# Patient Record
Sex: Female | Born: 1965 | Race: Black or African American | Hispanic: No | Marital: Married | Smoking: Current some day smoker
Health system: Southern US, Community
[De-identification: ages and names within clinical notes are randomized; demographics above are authoritative.]

## PROBLEM LIST (undated history)

## (undated) DIAGNOSIS — F32A Depression, unspecified: Secondary | ICD-10-CM

## (undated) DIAGNOSIS — F329 Major depressive disorder, single episode, unspecified: Secondary | ICD-10-CM

## (undated) DIAGNOSIS — E669 Obesity, unspecified: Secondary | ICD-10-CM

## (undated) DIAGNOSIS — F419 Anxiety disorder, unspecified: Secondary | ICD-10-CM

## (undated) DIAGNOSIS — K219 Gastro-esophageal reflux disease without esophagitis: Secondary | ICD-10-CM

## (undated) DIAGNOSIS — G43909 Migraine, unspecified, not intractable, without status migrainosus: Secondary | ICD-10-CM

## (undated) HISTORY — DX: Obesity, unspecified: E66.9

## (undated) HISTORY — DX: Depression, unspecified: F32.A

## (undated) HISTORY — DX: Gastro-esophageal reflux disease without esophagitis: K21.9

## (undated) HISTORY — DX: Anxiety disorder, unspecified: F41.9

## (undated) HISTORY — DX: Major depressive disorder, single episode, unspecified: F32.9

## (undated) HISTORY — DX: Migraine, unspecified, not intractable, without status migrainosus: G43.909

---

## 1997-04-27 ENCOUNTER — Inpatient Hospital Stay (HOSPITAL_COMMUNITY): Admission: AD | Admit: 1997-04-27 | Discharge: 1997-04-30 | Payer: Self-pay | Admitting: Obstetrics & Gynecology

## 1997-06-17 ENCOUNTER — Observation Stay (HOSPITAL_COMMUNITY): Admission: AD | Admit: 1997-06-17 | Discharge: 1997-06-18 | Payer: Self-pay | Admitting: Obstetrics and Gynecology

## 1997-06-21 ENCOUNTER — Inpatient Hospital Stay (HOSPITAL_COMMUNITY): Admission: AD | Admit: 1997-06-21 | Discharge: 1997-06-21 | Payer: Self-pay | Admitting: Obstetrics and Gynecology

## 1997-06-22 ENCOUNTER — Inpatient Hospital Stay (HOSPITAL_COMMUNITY): Admission: AD | Admit: 1997-06-22 | Discharge: 1997-06-22 | Payer: Self-pay | Admitting: Obstetrics and Gynecology

## 1997-06-23 ENCOUNTER — Inpatient Hospital Stay (HOSPITAL_COMMUNITY): Admission: AD | Admit: 1997-06-23 | Discharge: 1997-06-23 | Payer: Self-pay | Admitting: Obstetrics and Gynecology

## 1997-06-24 ENCOUNTER — Inpatient Hospital Stay (HOSPITAL_COMMUNITY): Admission: AD | Admit: 1997-06-24 | Discharge: 1997-06-24 | Payer: Self-pay | Admitting: Obstetrics and Gynecology

## 1997-06-28 ENCOUNTER — Inpatient Hospital Stay (HOSPITAL_COMMUNITY): Admission: AD | Admit: 1997-06-28 | Discharge: 1997-06-30 | Payer: Self-pay | Admitting: Obstetrics and Gynecology

## 1997-07-08 ENCOUNTER — Inpatient Hospital Stay (HOSPITAL_COMMUNITY): Admission: AD | Admit: 1997-07-08 | Discharge: 1997-07-08 | Payer: Self-pay | Admitting: Obstetrics and Gynecology

## 1997-07-15 ENCOUNTER — Inpatient Hospital Stay (HOSPITAL_COMMUNITY): Admission: AD | Admit: 1997-07-15 | Discharge: 1997-07-15 | Payer: Self-pay | Admitting: Obstetrics and Gynecology

## 1997-07-16 ENCOUNTER — Inpatient Hospital Stay (HOSPITAL_COMMUNITY): Admission: AD | Admit: 1997-07-16 | Discharge: 1997-07-16 | Payer: Self-pay | Admitting: Obstetrics and Gynecology

## 1997-07-19 ENCOUNTER — Ambulatory Visit (HOSPITAL_COMMUNITY): Admission: RE | Admit: 1997-07-19 | Discharge: 1997-07-19 | Payer: Self-pay | Admitting: Obstetrics and Gynecology

## 1997-07-28 ENCOUNTER — Inpatient Hospital Stay (HOSPITAL_COMMUNITY): Admission: AD | Admit: 1997-07-28 | Discharge: 1997-07-28 | Payer: Self-pay | Admitting: Obstetrics and Gynecology

## 1997-07-29 ENCOUNTER — Inpatient Hospital Stay (HOSPITAL_COMMUNITY): Admission: AD | Admit: 1997-07-29 | Discharge: 1997-08-01 | Payer: Self-pay | Admitting: Obstetrics & Gynecology

## 1997-09-14 ENCOUNTER — Other Ambulatory Visit: Admission: RE | Admit: 1997-09-14 | Discharge: 1997-09-14 | Payer: Self-pay | Admitting: Obstetrics and Gynecology

## 1998-09-26 ENCOUNTER — Other Ambulatory Visit: Admission: RE | Admit: 1998-09-26 | Discharge: 1998-09-26 | Payer: Self-pay | Admitting: *Deleted

## 1999-09-28 ENCOUNTER — Other Ambulatory Visit: Admission: RE | Admit: 1999-09-28 | Discharge: 1999-09-28 | Payer: Self-pay | Admitting: *Deleted

## 2000-11-28 ENCOUNTER — Other Ambulatory Visit: Admission: RE | Admit: 2000-11-28 | Discharge: 2000-11-28 | Payer: Self-pay | Admitting: *Deleted

## 2000-12-20 ENCOUNTER — Encounter (INDEPENDENT_AMBULATORY_CARE_PROVIDER_SITE_OTHER): Payer: Self-pay | Admitting: Specialist

## 2000-12-20 ENCOUNTER — Other Ambulatory Visit: Admission: RE | Admit: 2000-12-20 | Discharge: 2000-12-20 | Payer: Self-pay | Admitting: *Deleted

## 2001-12-18 ENCOUNTER — Other Ambulatory Visit: Admission: RE | Admit: 2001-12-18 | Discharge: 2001-12-18 | Payer: Self-pay | Admitting: *Deleted

## 2002-04-03 ENCOUNTER — Encounter: Admission: RE | Admit: 2002-04-03 | Discharge: 2002-04-03 | Payer: Self-pay | Admitting: Emergency Medicine

## 2002-04-03 ENCOUNTER — Encounter: Payer: Self-pay | Admitting: Emergency Medicine

## 2002-12-15 ENCOUNTER — Other Ambulatory Visit: Admission: RE | Admit: 2002-12-15 | Discharge: 2002-12-15 | Payer: Self-pay | Admitting: *Deleted

## 2003-09-10 ENCOUNTER — Other Ambulatory Visit: Admission: RE | Admit: 2003-09-10 | Discharge: 2003-09-10 | Payer: Self-pay | Admitting: Obstetrics and Gynecology

## 2004-01-23 ENCOUNTER — Inpatient Hospital Stay (HOSPITAL_COMMUNITY): Admission: AD | Admit: 2004-01-23 | Discharge: 2004-01-24 | Payer: Self-pay | Admitting: Obstetrics and Gynecology

## 2004-03-27 ENCOUNTER — Encounter (INDEPENDENT_AMBULATORY_CARE_PROVIDER_SITE_OTHER): Payer: Self-pay | Admitting: *Deleted

## 2004-03-27 ENCOUNTER — Inpatient Hospital Stay (HOSPITAL_COMMUNITY): Admission: RE | Admit: 2004-03-27 | Discharge: 2004-03-29 | Payer: Self-pay | Admitting: Obstetrics and Gynecology

## 2004-04-03 ENCOUNTER — Inpatient Hospital Stay (HOSPITAL_COMMUNITY): Admission: AD | Admit: 2004-04-03 | Discharge: 2004-04-03 | Payer: Self-pay | Admitting: Obstetrics and Gynecology

## 2004-08-31 ENCOUNTER — Other Ambulatory Visit: Admission: RE | Admit: 2004-08-31 | Discharge: 2004-08-31 | Payer: Self-pay | Admitting: *Deleted

## 2005-02-19 HISTORY — PX: PARTIAL HYSTERECTOMY: SHX80

## 2005-07-19 ENCOUNTER — Encounter (INDEPENDENT_AMBULATORY_CARE_PROVIDER_SITE_OTHER): Payer: Self-pay | Admitting: Specialist

## 2005-07-19 ENCOUNTER — Inpatient Hospital Stay (HOSPITAL_COMMUNITY): Admission: RE | Admit: 2005-07-19 | Discharge: 2005-07-21 | Payer: Self-pay | Admitting: *Deleted

## 2005-10-29 ENCOUNTER — Other Ambulatory Visit: Admission: RE | Admit: 2005-10-29 | Discharge: 2005-10-29 | Payer: Self-pay | Admitting: *Deleted

## 2006-12-23 ENCOUNTER — Other Ambulatory Visit: Admission: RE | Admit: 2006-12-23 | Discharge: 2006-12-23 | Payer: Self-pay | Admitting: *Deleted

## 2007-12-23 ENCOUNTER — Other Ambulatory Visit: Admission: RE | Admit: 2007-12-23 | Discharge: 2007-12-23 | Payer: Self-pay | Admitting: Gynecology

## 2008-01-02 ENCOUNTER — Encounter: Admission: RE | Admit: 2008-01-02 | Discharge: 2008-01-02 | Payer: Self-pay | Admitting: Gynecology

## 2008-03-08 ENCOUNTER — Other Ambulatory Visit: Admission: RE | Admit: 2008-03-08 | Discharge: 2008-03-08 | Payer: Self-pay | Admitting: Gynecology

## 2009-01-03 ENCOUNTER — Encounter: Admission: RE | Admit: 2009-01-03 | Discharge: 2009-01-03 | Payer: Self-pay | Admitting: Obstetrics and Gynecology

## 2010-04-18 ENCOUNTER — Other Ambulatory Visit: Payer: Self-pay | Admitting: Obstetrics and Gynecology

## 2010-07-07 NOTE — Discharge Summary (Signed)
NAME:  Linda Garner, HAUGE           ACCOUNT NO.:  [id number]   MEDICAL RECORD NO.:  [id number]          PATIENT TYPE:  INP   LOCATION:  9132                          FACILITY:  [facility]   PHYSICIAN:  Michelle L. Grewal, M.D.DATE OF BIRTH:  [birth year]   DATE OF ADMISSION:  03/27/2004  DATE OF DISCHARGE:  03/29/2004                                 DISCHARGE SUMMARY   ADMITTING DIAGNOSES:  1.  Intrauterine pregnancy at term.  2.  Previous cesarean section, desires repeat.  3.  Multiparity, desires permanent sterilization.   DISCHARGE DIAGNOSES:  1.  Status post low transverse cesarean section.  2.  Viable female infant.  3.  Permanent sterilization.   PROCEDURE:  1.  Repeat low transverse cesarean section.  2.  Bilateral tubal ligation.   REASON FOR ADMISSION:  Please see dictated H&P.   HOSPITAL COURSE:  The patient was a 45 year old African-American married  female gravida 2 para 1 that was admitted to [facility] for  a scheduled cesarean section. The patient had a history of a previous  cesarean and desired repeat. Due to multiparity, the patient had also  requested permanent sterilization. On the morning of admission, the patient  was taken to the operating room where spinal anesthesia was administered  without difficulty. A low transverse incision was made with the delivery of  a viable female infant weighing 6 pounds 9 ounces with Apgars of 9 at one  minute and 9 at five minutes. Arterial cord pH was 7.29. The patient  tolerated the procedure well and was taken to the recovery room in stable  condition. On postoperative day #1, the patient was without complaint. Vital  signs were stable. Abdomen was soft, fundus was firm and nontender.  Abdominal dressing was clean, dry, and intact. Labs revealed hemoglobin of  10.1; platelet count 167,000; wbc count of 9.2. on postoperative day #2, the  patient was without complaint. Vital signs remained stable, she was  afebrile. Abdomen was soft, fundus was firm and nontender. The patient  desired early discharge. The staples were removed and the patient was  discharged home.   CONDITION ON DISCHARGE:  Good.   DIET:  Regular as tolerated.   ACTIVITY:  No heavy lifting, no driving x2 weeks, no vaginal entry.   FOLLOW-UP:  The patient is to follow up in the office in 1-2 weeks for an  incision check. She is to call for temperature greater than 100 degrees,  persistent nausea and vomiting, heavy vaginal bleeding, and/or redness or  drainage from the incisional site.   DISCHARGE MEDICATIONS:  1.  Tylox #30 one p.o. q.4-6h. p.r.n.  2.  Motrin 600 mg q.6h.  3.  Prenatal vitamins one p.o. daily.  4.  Colace one p.o. daily p.r.n.      CC/MEDQ  D:  04/17/2004  T:  04/17/2004  Job:  [id number]

## 2010-07-07 NOTE — Discharge Summary (Signed)
NAME:  Linda Garner, Linda Garner           ACCOUNT NO.:  [id number]   MEDICAL RECORD NO.:  [id number]          PATIENT TYPE:  INP   LOCATION:  9302                          FACILITY:  [facility]   PHYSICIAN:  Almedia Balls. Fore, M.D.   DATE OF BIRTH:  1965-03-21   DATE OF ADMISSION:  07/19/2005  DATE OF DISCHARGE:  07/21/2005                                 DISCHARGE SUMMARY   HISTORY:  The patient is a 45 year old with abnormal uterine bleeding,  pelvic pain, slight uterine enlargement for hysterectomy.  The remainder of  her history and physical are as previously dictated.   LABORATORY DATA:  Include preoperative hemoglobin 12.3, 6200 white blood  cells.   HOSPITAL COURSE:  The patient was taken to operating room on 19 Jul 2005, at  which time abdominal supracervical hysterectomy, bilateral salpingectomy,  revision of abdominal wall keloid were performed.  The patient did well  postoperatively.  Diet and ambulation were progressed over several days  postoperatively.  On the morning of 21 July 2005, she was afebrile and  experiencing no problems except for pain which was controlled by oral  analgesics.  It was felt that she could be discharged at this time.   FINAL DIAGNOSES:  1.  Abnormal uterine bleeding.  2.  Pelvic pain.  3.  Slight uterine enlargement.  4.  Abdominal wall keloid.   OPERATION:  Abdominal supracervical hysterectomy, bilateral salpingectomy,  revision of abdominal wall keloid.   PATHOLOGY REPORT:  Unavailable at time of dictation.   DISPOSITION:  Discharged home to return the office in 2 weeks for followup.   She was instructed to gradually progress activities over several weeks at  home and to limit lifting and driving for 2 weeks.  She was fully  ambulatory, on regular diet, and in good condition at the time of discharge.   She was given prescriptions for:  1.  Darvocet N 100 generic, #30, to be taken 1/2 to 2 q.6h. p.r.n. pain.  2.  Doxycycline 100 mg, #12, to be taken  1 b.i.d..           ______________________________  Almedia Balls Randell Patient, M.D.     SRF/MEDQ  D:  07/21/2005  T:  07/21/2005  Job:  [id number]

## 2010-07-07 NOTE — Op Note (Signed)
Linda Garner, Linda Garner           ACCOUNT NO.:  [id number]   MEDICAL RECORD NO.:  [id number]          PATIENT TYPE:  INP   LOCATION:  9399                          [facility]ACILITY:  [facility]   PHYSICIAN:  Almedia Balls. Fore, M.D.   DATE OF BIRTH:  1965/08/05   DATE OF PROCEDURE:  07/19/2005  DATE OF DISCHARGE:                                 OPERATIVE REPORT   PREOPERATIVE DIAGNOSES:  1.  Abnormal uterine bleeding.  2.  Pelvic pain.  3.  Slight uterine enlargement.  4.  Abdominal wall keloid.   POSTOPERATIVE DIAGNOSIS:  1.  Abnormal uterine bleeding.  2.  Pelvic pain.  3.  Slight uterine enlargement.  4.  Abdominal wall keloid.  5.  Pending pathology.   OPERATIONS:  1.  Abdominal supracervical hysterectomy.  2.  Revision of abdominal wall keloid.   ANESTHESIA:  General orotracheal.   OPERATOR:  Almedia Balls. Randell Patient, M.D.   FIRST ASSISTANT:  Gretta Cool, M.D.   INDICATIONS FOR SURGERY:  The patient is a 45 year old with the above-noted  problems who has been counseled as to the need for surgery to correct these  problems and the type of procedure to be performed, to include risks of  anesthesia, injury to bowel, bladder, blood vessels, ureters, postoperative  hemorrhage, infection, recuperation, and possible hormone replacement should  her ovaries be removed.  She fully understands all these considerations and  wishes to proceed on Jul 19, 2005.   OPERATIVE FINDINGS:  On entry into the abdomen, there were noted to be  several adhesions from the omentum to the anterior peritoneal surface  secondary to previous surgery.  The upper abdominal viscera were palpated  and found to be normal.  The area of the appendix was normal.  In the  pelvis, the uterus was mid-posterior, top normal size to approximately [redacted]  weeks gestational size.  Both ovaries appeared to be normal.   PROCEDURE:  With the patient under general anesthesia, prepared and draped  in usual sterile fashion, with a Foley  catheter in the bladder, a lower  abdominal transverse incision was made after excising a previous abdominal  wall keloid.  The incision was carried down to the peritoneal cavity with  care to ensure that injury to the underlying bowel was not carried out.  The  peritoneum was then entered, and a self-retaining retractor was placed.  Bowel was packed off.  The utero-ovarian anastomoses to the round ligaments  were clamped using Kelly clamps for traction.  The round ligaments  bilaterally were transected using Bovie electrocoagulation with development  of a bladder flap anteriorly.  This area was also adherent to the lower  uterine segment because of previous surgery.  Care was taken to avoid injury  to the bladder, which was then dissected off the lower uterine segment and  cervix.  Because the ovaries appeared normal, it was felt that they should  be conserved.  Accordingly, Heaney clamps were placed across the utero-  ovarian anastomoses and tubal mesenteries bilaterally; the tubes were  removed by incising this area with the ovarian stump and rendered hemostatic  with two  sutures of 0 Vicryl.  Uterine vessels bilaterally were then  skeletonized, clamped using Heaney clamps, cut, and suture ligated with 0  Vicryl.  Cardinal ligaments were then clamped with Heaney clamps, cut, and  suture ligated with 0 Vicryl.  It was then possible to excise the uterus  with endocervix by using Bovie electrocoagulation to core out these  structures.  The remaining rim of cervix was rendered hemostatic and  reapproximated with interrupted figure-of-eight sutures of 0 Vicryl.  The  area was lavaged with copious amounts of lactated Ringer's solution, and  after noting that hemostasis was maintained in this area, the area was  reperitonealized with a continuous suture of 3-0 PDS, with the ovaries being  suspended from the ipsilateral round ligaments.  Again, with correct sponge  and instrument count and good  hemostasis, the peritoneum was closed with  continuous suture of 0 Vicryl.  Fascia was closed with two sutures of 0 PDS  which were brought from the lateral aspects of the incision and tied in the  midline.  Each layer was lavaged with copious amounts of lactated Ringer's  solution prior to closure.  Subcutaneous fat was reapproximated with  interrupted mattress sutures of 0 Vicryl.  Skin was closed with a  subcuticular suture of 3-0 plain catgut.  Estimated blood loss:  250 mL.  The patient was taken to the recovery room in good condition, with clear  urine in the Foley catheter tubing.  She will be placed on outpatient with  extended recovery status following recovery room.           ______________________________  Almedia Balls Randell Patient, M.D.     SRF/MEDQ  D:  07/19/2005  T:  07/19/2005  Job:  [id number]   cc:   Gretta Cool, M.D.  Fax: [phone]

## 2010-07-07 NOTE — H&P (Signed)
NAME:  Linda Garner, Linda Garner           ACCOUNT NO.:  [id number]   MEDICAL RECORD NO.:  [id number]          PATIENT TYPE:  INP   LOCATION:  NA                            FACILITY:  [facility]   PHYSICIAN:  Guy Sandifer. Tomblin II, M.D.DATE OF BIRTH:  15-May-1965   DATE OF ADMISSION:  03/27/2004  DATE OF DISCHARGE:                                HISTORY & PHYSICAL   CHIEF COMPLAINT:  Desires repeat cesarean section.   HISTORY OF PRESENT ILLNESS:  This patient is a 45 year old, married, black  female with an EDC of April 05, 2004 consistent with early examination  whose prenatal care has been complicated by advanced maternal age.  First  trimester nuchal lucency ultrasound scanning was normal.  However, the blood  testing was consistent with an increased risk of Down syndrome.  Subsequent  level 2 ultrasound at 18 weeks revealed normal anatomy and the patient  elected to not purse amniocentesis.  She has a history of cesarean section  at 38 weeks.  After discussing the options, she wants repeat.  Group B beta  strep cultures is negative on March 07, 2004.   Past medical history, past surgical history, family history, obstetric  history, see prenatal history and physical.   MEDICATIONS:  Prenatal vitamins.   ALLERGIES:  SULFA, CODEINE and PENICILLIN.   SOCIAL HISTORY:  The patient denies tobacco, alcohol or drug abuse.   PHYSICAL EXAMINATION:  VITAL SIGNS:  Height 5 feet 2 inches, weight 194  pounds, blood pressure 108/80.  LUNGS:  Clear to auscultation.  HEART:  Regular rate and rhythm.  BREASTS:  Not examined.  ABDOMEN:  Gravid, nontender.  PELVIC:  Cervix is closed and thick.   ASSESSMENT:  1.  Intrauterine pregnancy at 68 weeks estimated gestational age.  2.  Previous cesarean section, desires repeat.   PLAN:  Repeat cesarean section.      JET/MEDQ  D:  03/24/2004  T:  03/24/2004  Job:  [id number]

## 2010-07-07 NOTE — Op Note (Signed)
NAME:  Linda Garner, Linda Garner           ACCOUNT NO.:  [id number]   MEDICAL RECORD NO.:  [id number]          PATIENT TYPE:  INP   LOCATION:  9132                          [facility]ACILITY:  [facility]   PHYSICIAN:  Guy Sandifer. Tomblin II, M.D.DATE OF BIRTH:  04/13/1965   DATE OF PROCEDURE:  03/27/2004  DATE OF DISCHARGE:                                 OPERATIVE REPORT   PREOPERATIVE DIAGNOSIS:  1.  Intrauterine pregnancy at term.  2.  Previous cesarean section, desires repeat.  3.  Desires permanent sterilization.   POSTOPERATIVE DIAGNOSIS:  1.  Intrauterine pregnancy at term.  2.  Previous cesarean section, desires repeat.  3.  Desires permanent sterilization.   PROCEDURE:  1.  Repeat cesarean section.  2.  Bilateral tubal ligation.   SURGEON:  Guy Sandifer. Henderson Cloud, M.D.   ANESTHESIA:  Spinal.   ESTIMATED BLOOD LOSS:  600 mL.   FINDINGS:  Viable female infant with Apgars of 9 and 9 at one and five  minutes, respectively.  Birth weight 6 pounds 9 ounces, arterial cord pH  7.29.   INDICATIONS FOR PROCEDURE:  This patient is a 45 year old married black  female with an EDC of April 05, 2004, who has previous cesarean section  and desires a repeat.  She also desires permanent sterilization.  Potential  risks and complications have been discussed preoperatively.  Tubal ligation  permanence, alternative methods of contraception, failure rate, and  increased ectopic risks have also been reviewed preoperatively. All  questions were answered.   DESCRIPTION OF PROCEDURE:  The patient was taken to the operating room where  she was identified, spinal anesthetic was placed, and she was placed in the  dorsal supine position with a 15 degree left lateral wedge.  She was then  prepped abdominally, Foley catheter was placed in the bladder as a drain,  and she was draped in a sterile fashion.  After testing for adequate spinal  anesthesia, the skin was entered through the Pfannenstiel incision and  dissection  is carried out in layers to the peritoneum.  The peritoneum is  incised and extended superiorly and inferiorly.  The vesicouterine  peritoneum is taken down cephalolaterally.  The bladder flap is developed  and the bladder blade is placed.  The uterus is incised in a low transverse  manner.  The uterine cavity is entered bluntly with a Hemostat.  The uterine  incision is extended cephalolaterally with the fingers.  Artificial rupture  of membranes reveals clear fluid.  Vertex is then delivered with the aid of  a Tendertouch vacuum with one gentle pull without difficulty.  Oral and  nasopharynx were suctioned.  Single nuchal cord is reduced.  Remainder of  the baby is delivered.  Good cry and tone is noted.  The cord is clamped and  cut and the baby is handed to the awaiting pediatrics team.  The placenta is  manually delivered.  The uterine cavity is cleaned.  The uterus is closed in  two running locking imbricating layers of 0 Monocryl suture.  An additional  figure-of-eight is placed at the right angle to achieve complete hemostasis.  Ovaries  are normal bilaterally.  The left fallopian tube is identified from  cornua to fimbria.  It is then grasped in the midampullary portion with a  Babcock clamp and a knuckle of tube is ligated with two free ties of 0 plain  suture.  The intervening knuckle is then resected sharply.  Cautery is used  to assure complete hemostasis.  Similar procedure is carried out on the  right side.  Irrigation is carried out.  The anterior peritoneum is closed  in a running fashion with 0 Monocryl suture which is also used to  reapproximate the pyramidalis muscle in the midline.  Anterior rectus fascia  is closed in a running fashion with 0 PDS suture and the skin is closed with  clips.  All needle, sponge, and instrument counts correct.  The patient is  transferred to the recovery room in stable condition.      JET/MEDQ  D:  03/27/2004  T:  03/27/2004  Job:   [id number]

## 2010-07-07 NOTE — H&P (Signed)
NAME:  Linda Garner, Linda Garner           ACCOUNT NO.:  [id number]   MEDICAL RECORD NO.:  [id number]          PATIENT TYPE:  INP   LOCATION:  NA                            [facility]   PHYSICIAN:  Almedia Balls. Fore, M.D.   DATE OF BIRTH:  1965-10-09   DATE OF ADMISSION:  07/19/2005  DATE OF DISCHARGE:                                HISTORY & PHYSICAL   CHIEF COMPLAINT:  Abnormal bleeding, pelvic pain, uterine enlargement.   HISTORY:  The patient is a 45 year old gravida 3, para 2 whose last  menstrual period was Jun 19, 2005.  She has been seen in our office since May  of 1998 with progressively severe pain.  She has had two pregnancies since  that time with cesarean sections each time.  Uterus on recent examination  was found to be enlarged and soft with tender areas within the uterus.  Ultrasound and biopsy done in August of 2006 revealed uterine enlargement,  __________ myometrium suggestive of adenomyosis, and small ovarian cysts  bilaterally suggestive of follicles.  The biopsy was benign.  Pap smear in  July of 2006 was within normal limits as have all her Pap smears been.  She  is admitted at this time for TAH, possible BSO.  She has been fully  counseled as to the nature of the procedure and the risks involved to  include risks of anesthesia, injury to bowel, bladder, blood vessels,  ureters, postoperative hemorrhage, infection, recuperation, and possible  hormone replacement should her ovaries be removed.  She fully understands  all these considerations and wishes to proceed on Jul 19, 2005.   PAST MEDICAL HISTORY:  Includes the above-noted history plus ectopic  pregnancy with partial salpingectomy of the right tube in 1990.  She  underwent laparoscopy in 1994 and 1997 with findings of probable  endometriosis.  She is a nonsmoker, nondrinker.  Allergic to CODEINE and  SULFA.   FAMILY HISTORY:  Grandmother died of some form of leukemia.   REVIEW OF SYSTEMS:  HEENT:  Negative per  prior.  CARDIORESPIRATORY:  Negative.  GASTROINTESTINAL:  Negative.  GENITOURINARY:  As in present  illness.  NEUROMUSCULAR:  Negative.   PHYSICAL EXAMINATION:  VITAL SIGNS:  Height 5 feet, 2-1/2 inches tall,  weight 174 pounds, blood pressure 118/74, pulse 80, respirations 16.  GENERAL:  Well-developed black female in no acute distress.  HEENT: Within normal limits.  NECK:  Supple without mass, adenopathy, or bruit.  HEART:  Regular rate and rhythm without murmurs.  LUNGS:  Clear to P&A.  BREASTS:  Sitting and lying without masses.  Axilla negative.  ABDOMEN:  Flat and soft without mass.  Slightly tender in the lower central  area.  PELVIC:  External genitalia:  Bartholin's, urethral, and Skene's glands  within normal limits.  Vagina is clean.  Cervix is slightly inflamed.  Uterus is mid position, top normal size to approximately [redacted] weeks gestational  size, and tender on palpation and manipulation.  Adnexa are slightly tender  bilaterally, right greater than left.  Anterior and posterior cul-de-sac  examination is confirmatory.  EXTREMITIES:  Within normal limits.  CENTRAL NERVOUS SYSTEM:  Grossly intact.  SKIN:  Without suspicious lesions.   IMPRESSION:  1.  Abnormal uterine bleeding.  2.  Pelvic pain.  3.  Uterine enlargement.   DISPOSITION:  As noted above.           ______________________________  Almedia Balls. Randell Patient, M.D.     SRF/MEDQ  D:  07/12/2005  T:  07/12/2005  Job:  [id number]

## 2011-10-19 ENCOUNTER — Ambulatory Visit (INDEPENDENT_AMBULATORY_CARE_PROVIDER_SITE_OTHER): Payer: Managed Care, Other (non HMO) | Admitting: Emergency Medicine

## 2011-10-19 VITALS — BP 115/75 | HR 80 | Temp 98.5°F | Resp 16 | Ht 62.25 in | Wt 174.2 lb

## 2011-10-19 DIAGNOSIS — N3000 Acute cystitis without hematuria: Secondary | ICD-10-CM

## 2011-10-19 DIAGNOSIS — M545 Low back pain: Secondary | ICD-10-CM

## 2011-10-19 DIAGNOSIS — R309 Painful micturition, unspecified: Secondary | ICD-10-CM

## 2011-10-19 DIAGNOSIS — N23 Unspecified renal colic: Secondary | ICD-10-CM

## 2011-10-19 LAB — POCT URINALYSIS DIPSTICK
Bilirubin, UA: NEGATIVE
Ketones, UA: NEGATIVE
Spec Grav, UA: 1.02
pH, UA: 7

## 2011-10-19 LAB — POCT UA - MICROSCOPIC ONLY: Crystals, Ur, HPF, POC: NEGATIVE

## 2011-10-19 MED ORDER — PHENAZOPYRIDINE HCL 200 MG PO TABS: 200.0000 mg | ORAL_TABLET | Freq: Three times a day (TID) | ORAL | Status: AC | PRN

## 2011-10-19 MED ORDER — CIPROFLOXACIN HCL 500 MG PO TABS: 500.0000 mg | ORAL_TABLET | Freq: Two times a day (BID) | ORAL | Status: AC

## 2011-10-19 NOTE — Progress Notes (Signed)
Date:  10/19/2011   Name:  Linda Garner   DOB:  1965-10-21   MRN:  [id number] Gender: female Age: 46 y.o.  PCP:  No primary provider on file.    Chief Complaint: Urinary Frequency   History of Present Illness:  Linda Garner is a 46 y.o. pleasant patient who presents with the following:  Today developed dysuria, urgency and frequency and foul smelling urine.  No fever or chills, nausea or vomiting, no vaginal discharge or bleeding. Noticed some blood on tissue after voiding.  No stool change.  Some low back pain.  There is no problem list on file for this patient.   No past medical history on file.  No past surgical history on file.  History  Substance Use Topics  . Smoking status: Never Smoker   . Smokeless tobacco: Not on file  . Alcohol Use: Not on file    No family history on file.  No Known Allergies  Medication list has been reviewed and updated.  No current outpatient prescriptions on file prior to visit.    Review of Systems:  As per HPI, otherwise negative.    Physical Examination: Filed Vitals:   10/19/11 1614  BP: 115/75  Pulse: 80  Temp: 98.5 F (36.9 C)  Resp: 16   Filed Vitals:   10/19/11 1614  Height: 5' 2.25" (1.581 m)  Weight: 174 lb 3.2 oz (79.017 kg)   Body mass index is 31.61 kg/(m^2). Ideal Body Weight: Weight in (lb) to have BMI = 25: 137.5    GEN: WDWN, NAD, Non-toxic, Alert & Oriented x 3 HEENT: Atraumatic, Normocephalic.  Ears and Nose: No external deformity. EXTR: No clubbing/cyanosis/edema NEURO: Normal gait.  PSYCH: Normally interactive. Conversant. Not depressed or anxious appearing.  Calm demeanor.  Abdomen:  Some suprapubic tenderness.  No flank tenderness  Results for orders placed in visit on 10/19/11  POCT URINALYSIS DIPSTICK      Component Value Range   Color, UA yellow     Clarity, UA slightly cloudy     Glucose, UA negative     Bilirubin, UA negative     Ketones, UA negative     Spec Grav, UA 1.020     Blood, UA trace-lysed     pH, UA 7.0     Protein, UA negative     Urobilinogen, UA 0.2     Nitrite, UA negative     Leukocytes, UA small (1+)       Assessment and Plan: Cystitis cipro pyridium Follow up as needed Carmelina Dane, MD  Results for orders placed in visit on 10/19/11  POCT UA - MICROSCOPIC ONLY      Component Value Range   WBC, Ur, HPF, POC 1-3     RBC, urine, microscopic 0-1     Bacteria, U Microscopic trace     Mucus, UA trace     Epithelial cells, urine per micros 3-5     Crystals, Ur, HPF, POC negative     Casts, Ur, LPF, POC negative     Yeast, UA negative    POCT URINALYSIS DIPSTICK      Component Value Range   Color, UA yellow     Clarity, UA slightly cloudy     Glucose, UA negative     Bilirubin, UA negative     Ketones, UA negative     Spec Grav, UA 1.020     Blood, UA trace-lysed     pH, UA 7.0  Protein, UA negative     Urobilinogen, UA 0.2     Nitrite, UA negative     Leukocytes, UA small (1+)

## 2012-05-09 ENCOUNTER — Other Ambulatory Visit: Payer: Self-pay | Admitting: Obstetrics and Gynecology

## 2012-05-09 DIAGNOSIS — R928 Other abnormal and inconclusive findings on diagnostic imaging of breast: Secondary | ICD-10-CM

## 2012-05-22 ENCOUNTER — Ambulatory Visit
Admission: RE | Admit: 2012-05-22 | Discharge: 2012-05-22 | Disposition: A | Discharge status: Home / Self Care | Payer: Private Health Insurance - Indemnity | Source: Ambulatory Visit | Attending: Obstetrics and Gynecology | Admitting: Obstetrics and Gynecology

## 2012-05-22 DIAGNOSIS — R928 Other abnormal and inconclusive findings on diagnostic imaging of breast: Secondary | ICD-10-CM

## 2014-01-08 ENCOUNTER — Telehealth: Payer: Self-pay | Admitting: Internal Medicine

## 2014-01-08 NOTE — Telephone Encounter (Deleted)
PT NEEDS 3 MORE WRITTEN RX'S FOR ATEROL FOR DEC, JAN, AND FEB , PLS MAIL

## 2016-06-06 ENCOUNTER — Telehealth: Payer: Self-pay | Admitting: Cardiovascular Disease

## 2016-06-06 NOTE — Telephone Encounter (Signed)
Received records from [facility] for appointment on 06/26/16 with Dr Allyson Sabal.  Records put with Dr Hazle Coca schedule for 06/26/16. lp

## 2016-06-08 ENCOUNTER — Ambulatory Visit: Payer: Private Health Insurance - Indemnity | Admitting: Cardiovascular Disease

## 2016-06-21 ENCOUNTER — Encounter: Payer: Self-pay | Admitting: Anesthesiology

## 2016-06-26 ENCOUNTER — Encounter: Payer: Self-pay | Admitting: Cardiovascular Disease

## 2016-06-26 ENCOUNTER — Ambulatory Visit (INDEPENDENT_AMBULATORY_CARE_PROVIDER_SITE_OTHER): Payer: Private Health Insurance - Indemnity | Admitting: Cardiovascular Disease

## 2016-06-26 VITALS — BP 116/78 | HR 79 | Ht 61.0 in | Wt 188.2 lb

## 2016-06-26 DIAGNOSIS — R0789 Other chest pain: Secondary | ICD-10-CM

## 2016-06-26 DIAGNOSIS — R072 Precordial pain: Secondary | ICD-10-CM | POA: Diagnosis not present

## 2016-06-26 NOTE — Assessment & Plan Note (Signed)
Ms. Linda Garner is a 51 year old moderately overweight female mother of 2 referred by Dr. Sherwood GamblerFusco for atypical chest pain. 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 She has a lot of stress at home as well as at work. She's had new-onset chest pain over the last month occurring on a daily basis. The pain is stabbing, sharp and lasts for seconds. There are no other associated symptoms. I believe her chest pain is noncardiac. She does have risk factors including family history the father who had stents. I am going to get a routine GXT and see her back as needed. minutes at a time

## 2016-06-26 NOTE — Progress Notes (Signed)
     06/26/2016 Linda SisLaura Holberg   01/02/1966  [id number]  Primary Physician Elfredia NevinsFusco, Lawrence, MD Primary Cardiologist: Runell GessJonathan J Tynisha Ogan MD Roseanne RenoFACP, FACC, FAHA, FSCAI  HPI:    Ms. Linda Garner is a 51 year old moderately overweight married female mother of 2 children referred by Dr. Sherwood GamblerFusco  For new-onset atypical chest pain. Her only  Risk  factor is family history.  She is apparently under a lot of tress at home and at work. She has never had a heart attack or stroke.  The pain is new onset over the last month, substernal  Occurring on a  Daily basis lasting for minute at a time.  Current Outpatient Prescriptions  Medication Sig Dispense Refill  . ALPRAZolam (XANAX) 0.5 MG tablet Take 0.5 mg by mouth at bedtime as needed for anxiety.    . Multiple Vitamin (MULTIVITAMIN) tablet Take 1 tablet by mouth daily.    . phentermine 37.5 MG capsule Take 37.5 mg by mouth every morning.     No current facility-administered medications for this visit.     No Known Allergies  Social History   Social History  . Marital status: Married    Spouse name: N/A  . Number of children: 2  . Years of education: N/A   Occupational History  . UNEMPLOYED    Social History Main Topics  . Smoking status: Never Smoker  . Smokeless tobacco: Never Used  . Alcohol use Not on file  . Drug use: No  . Sexual activity: Not on file   Other Topics Concern  . Not on file   Social History Narrative  . No narrative on file     Review of Systems: General: negative for chills, fever, night sweats or weight changes.  Cardiovascular: negative for chest pain, dyspnea on exertion, edema, orthopnea, palpitations, paroxysmal nocturnal dyspnea or shortness of breath Dermatological: negative for rash Respiratory: negative for cough or wheezing Urologic: negative for hematuria Abdominal: negative for nausea, vomiting, diarrhea, bright red blood per rectum, melena, or hematemesis Neurologic: negative for visual changes, syncope, or  dizziness All other systems reviewed and are otherwise negative except as noted above.    Blood pressure 116/78, pulse 79, height 5\' 1"  (1.549 m), weight 188 lb 3.2 oz (85.4 kg).  General appearance: alert and no distress Neck: no adenopathy, no carotid bruit, no JVD, supple, symmetrical, trachea midline and thyroid not enlarged, symmetric, no tenderness/mass/nodules Lungs: clear to auscultation bilaterally Heart: regular rate and rhythm, S1, S2 normal, no murmur, click, rub or gallop Extremities: extremities normal, atraumatic, no cyanosis or edema  EKG normal sinus rhythm at 79 without ST or T-wave changes. I personally reviewed this EKG  ASSESSMENT AND PLAN:   Atypical chest pain Ms. Linda Garner is a 51 year old moderately overweight female mother of 2 referred by Dr. Sherwood GamblerFusco for atypical chest pain. 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 She has a lot of stress at home as well as at work. She's had new-onset chest pain over the last month occurring on a daily basis. The pain is stabbing, sharp and lasts for seconds. There are no other associated symptoms. I believe her chest pain is noncardiac. She does have risk factors including family history the father who had stents. I am going to get a routine GXT and see her back as needed. minutes at a time      Runell GessJonathan J. Kaysa Roulhac MD BaldwinFACP,FACC,FAHA, [facility]SCAI 06/26/2016 4:15 PM

## 2016-06-26 NOTE — Patient Instructions (Signed)
Your physician has requested that you have an exercise tolerance test. For further information please visit [email or web address]. Please also follow instruction sheet, as given.  Dr Berry recommends that you follow-up with him as needed. 

## 2016-07-04 ENCOUNTER — Telehealth (HOSPITAL_COMMUNITY): Payer: Self-pay | Admitting: Cardiovascular Disease

## 2016-07-05 NOTE — Telephone Encounter (Signed)
  07/04/2016 02:49 PM Phone (Outgoing) Claudius Sisrice, [place] (Self) [phone] (H)   Left Message - Called pt and lmsg for her to CB to get r/s for ETT    By Elita BooneGriffin, Preslee Regas A

## 2016-07-10 ENCOUNTER — Inpatient Hospital Stay (HOSPITAL_COMMUNITY): Admission: RE | Admit: 2016-07-10 | Payer: Private Health Insurance - Indemnity | Source: Ambulatory Visit

## 2016-07-17 ENCOUNTER — Ambulatory Visit: Payer: Private Health Insurance - Indemnity | Admitting: Cardiovascular Disease

## 2016-07-31 ENCOUNTER — Telehealth (HOSPITAL_COMMUNITY): Payer: Self-pay | Admitting: Cardiovascular Disease

## 2016-07-31 NOTE — Telephone Encounter (Signed)
Patient called back today and voiced that she did not want to reschedule her ETT at this time .  User: Trina AoGriffin, Keean Wilmeth A Date/time: 07/31/2016 2:03 PM  Comment: Called pt and lmsg for her to CB to get r/s for ETT.  Context: [place] Schedule Orders/Appt Requests Outcome: Left Message  Phone number: [phone] Phone Type: Home Phone  Comm. type: Telephone Call type: Outgoing  Contact: Claudius SisPrice, Brooks Relation to patient: Self  Letter:      User: Trina AoGriffin, Gem Conkle A Date/time: 07/19/2016 10:44 AM  Comment: Called pt and lmsg for her to CB to r/s GXT.  Context: [place] Schedule Orders/Appt Requests Outcome: Left Message  Phone number: [phone] Phone Type: Home Phone  Comm. type: Telephone Call type: Outgoing  Contact: Claudius SisPrice, Latasha Relation to patient: Self  Letter:      User: Trina AoGriffin, Shakeila Pfarr A Date/time: 07/04/2016 2:50 PM  Comment: Called pt and lmsg for her to CB to get r/s for ETT.   Context: [place] Schedule Orders/Appt Requests Outcome: Left Message  Phone number: [phone] Phone Type: Home Phone  Comm. type: Telephone Call type: Outgoing  Contact: Claudius SisPrice, Alianys Relation to patient: Self  Letter:

## 2017-04-08 ENCOUNTER — Encounter: Payer: Self-pay | Admitting: Gastroenterology

## 2017-05-06 ENCOUNTER — Ambulatory Visit: Payer: Private Health Insurance - Indemnity

## 2017-05-16 ENCOUNTER — Ambulatory Visit (INDEPENDENT_AMBULATORY_CARE_PROVIDER_SITE_OTHER): Payer: No Typology Code available for payment source

## 2017-05-16 DIAGNOSIS — Z1211 Encounter for screening for malignant neoplasm of colon: Secondary | ICD-10-CM

## 2017-05-16 NOTE — Progress Notes (Signed)
Gastroenterology Pre-Procedure Review  Request Date:05/16/17 Requesting Physician: Dwyane LuoBen Mann PA-C Dr.Golding ( no previous tcs)  PATIENT REVIEW QUESTIONS: The patient responded to the following health history questions as indicated:    1. Diabetes Melitis: no 2. Joint replacements in the past 12 months: no 3. Major health problems in the past 3 months: no 4. Has an artificial valve or MVP: no 5. Has a defibrillator: no 6. Has been advised in past to take antibiotics in advance of a procedure like teeth cleaning: no 7. Family history of colon cancer: no  8. Alcohol Use: no 9. History of sleep apnea: no  10. History of coronary artery or other vascular stents placed within the last 12 months: no 11. History of any prior anesthesia complications: no    MEDICATIONS & ALLERGIES:    Patient reports the following regarding taking any blood thinners:   Plavix? no Aspirin? no Coumadin? no Brilinta? no Xarelto? no Eliquis? no Pradaxa? no Savaysa? no Effient? no  Patient confirms/reports the following medications:  Current Outpatient Medications  Medication Sig Dispense Refill  . Multiple Vitamin (MULTIVITAMIN) tablet Take 1 tablet by mouth daily.     No current facility-administered medications for this visit.     Patient confirms/reports the following allergies:  Allergies  Allergen Reactions  . Codeine Other (See Comments)    No orders of the defined types were placed in this encounter.   AUTHORIZATION INFORMATION Primary Insurance: ,  ID #: ,  Group #:  Pre-Cert / Auth required: Pre-Cert / Auth #  Secondary Insurance: ,  ID #: ,  Group #:  Pre-Cert / Auth required:  Pre-Cert / Auth #:   SCHEDULE INFORMATION: Procedure has been scheduled as follows:  Date:    , Time: Location: [facility] Dr.Fields  This Gastroenterology Pre-Precedure Review Form is being routed to the following provider(s): Wynne DustEric Gill NP

## 2017-05-16 NOTE — Progress Notes (Signed)
Pt is unable to schedule at this time, she is going to check some dates and will call me back and instructions will be mailed.

## 2017-05-17 NOTE — Progress Notes (Signed)
Ok to schedule.

## 2017-05-20 ENCOUNTER — Encounter: Payer: Managed Care, Other (non HMO) | Admitting: Obstetrics and Gynecology

## 2018-01-09 ENCOUNTER — Encounter: Payer: Self-pay | Admitting: Obstetrics and Gynecology

## 2018-05-26 ENCOUNTER — Encounter: Payer: Managed Care, Other (non HMO) | Admitting: Obstetrics and Gynecology

## 2018-06-10 ENCOUNTER — Encounter: Payer: Self-pay | Admitting: Gastroenterology

## 2018-09-01 ENCOUNTER — Ambulatory Visit: Payer: No Typology Code available for payment source

## 2018-09-17 ENCOUNTER — Other Ambulatory Visit: Payer: Self-pay

## 2018-09-17 ENCOUNTER — Other Ambulatory Visit: Payer: No Typology Code available for payment source

## 2018-09-17 DIAGNOSIS — Z20822 Contact with and (suspected) exposure to covid-19: Secondary | ICD-10-CM

## 2018-09-18 LAB — NOVEL CORONAVIRUS, NAA: SARS-CoV-2, NAA: NOT DETECTED

## 2018-09-24 ENCOUNTER — Telehealth: Payer: Self-pay | Admitting: Internal Medicine

## 2018-09-24 NOTE — Telephone Encounter (Signed)
Pt called to get COVID results.  Informed pt those were negative.  Signed pt up for MyChart

## 2018-10-16 ENCOUNTER — Other Ambulatory Visit: Payer: Self-pay

## 2018-10-16 ENCOUNTER — Ambulatory Visit (INDEPENDENT_AMBULATORY_CARE_PROVIDER_SITE_OTHER): Payer: Self-pay | Admitting: *Deleted

## 2018-10-16 DIAGNOSIS — Z1211 Encounter for screening for malignant neoplasm of colon: Secondary | ICD-10-CM

## 2018-10-16 NOTE — Progress Notes (Addendum)
Gastroenterology Pre-Procedure Review  Request Date: 10/16/2018 Requesting Physician: Dr. Gerarda Fraction @ [facility], No previous TCS  PATIENT REVIEW QUESTIONS: The patient responded to the following health history questions as indicated:    1. Diabetes Melitis: No 2. Joint replacements in the past 12 months: No 3. Major health problems in the past 3 months: No 4. Has an artificial valve or MVP: No 5. Has a defibrillator: No 6. Has been advised in past to take antibiotics in advance of a procedure like teeth cleaning: No 7. Family history of colon cancer: No  8. Alcohol Use: Yes, 1 glass of wine a week 9. History of sleep apnea:  No 10. History of coronary artery or other vascular stents placed within the last 12 months: No 11. History of any prior anesthesia complications: No    MEDICATIONS & ALLERGIES:    Patient reports the following regarding taking any blood thinners:   Plavix? No Aspirin? No Coumadin? No Brilinta? No Xarelto? No Eliquis? No Pradaxa? No Savaysa? No Effient? No  Patient confirms/reports the following medications:  Current Outpatient Medications  Medication Sig Dispense Refill  . Multiple Vitamin (MULTIVITAMIN) tablet Take 1 tablet by mouth daily.     No current facility-administered medications for this visit.     Patient confirms/reports the following allergies:  Allergies  Allergen Reactions  . Codeine Other (See Comments)    No orders of the defined types were placed in this encounter.   AUTHORIZATION INFORMATION Primary Insurance: [place],  [place] #: [id number],  Group #: [id number] Pre-Cert / Josem Kaufmann required: No, not required  SCHEDULE INFORMATION: Procedure has been scheduled as follows:  Date: 12/22/2018 , Time: 10:30 Location: [facility] with Dr. Oneida Alar  This Gastroenterology Pre-Precedure Review Form is being routed to the following provider(s): Neil Crouch, PA-C

## 2018-10-16 NOTE — Progress Notes (Signed)
Pt wants to wait until we can schedule her on a Mon.  We did not have any available in Oct.  Pt aware that I will call her when Nov schedules are available.  Pt voiced understanding.

## 2018-10-17 NOTE — Progress Notes (Signed)
Ok to schedule with conscious sedation when she is ready.

## 2018-10-21 MED ORDER — PEG 3350-KCL-NA BICARB-NACL 420 G PO SOLR: 4000.0000 mL | Freq: Once | ORAL | 0 refills | Status: AC

## 2018-10-21 NOTE — Patient Instructions (Addendum)
Linda Garner   04-08-1965 MRN: [id number]    Procedure Date: 01/26/2019 Time to register: 9:30 am Place to register: [facility] Procedure Time: 10:30 am Scheduled provider: Dr. Oneida Alar  PREPARATION FOR COLONOSCOPY WITH TRI-LYTE SPLIT PREP  Please notify us immediately if you are diabetic, take iron supplements, or if you are on Coumadin or any other blood thinners.   You will need to purchase 1 fleet enema and 1 box of Bisacodyl '5mg'$  tablets.   1 DAY BEFORE PROCEDURE:  DATE: 01/25/2019  DAY: Sunday Continue clear liquids the entire day - NO SOLID FOOD.    At 2:00 pm:  Take 2 Bisacodyl tablets.   At 4:00pm:  Start drinking your solution. Make sure you mix well per instructions on the bottle. Try to drink 1 (one) 8 ounce glass every 10-15 minutes until you have consumed HALF the jug. You should complete by 6:00pm.You must keep the left over solution refrigerated until completed next day.  Continue clear liquids. You must drink plenty of clear liquids to prevent dehyration and kidney failure.     DAY OF PROCEDURE:   DATE: 01/26/2019  DAY: Monday If you take medications for your heart, blood pressure or breathing, you may take these medications.   Five hours before your procedure time @ 5:30 am:  Finish remaining amout of bowel prep, drinking 1 (one) 8 ounce glass every 10-15 minutes until complete. You have two hours to consume remaining prep.   Three hours before your procedure time @ 7:30 am:  Nothing by mouth.   At least one hour before going to the hospital:  Give yourself one Fleet enema. You may take your morning medications with sip of water unless we have instructed otherwise.      Please see below for Dietary Information.  CLEAR LIQUIDS INCLUDE:  Water Jello (NOT red in color)   Ice Popsicles (NOT red in color)   Tea (sugar ok, no milk/cream) Powdered fruit flavored drinks  Coffee (sugar ok, no milk/cream) Gatorade/ Lemonade/ Kool-Aid  (NOT red in color)    Juice: apple, white grape, white cranberry Soft drinks  Clear bullion, consomme, broth (fat free beef/chicken/vegetable)  Carbonated beverages (any kind)  Strained chicken noodle soup Hard Candy   Remember: Clear liquids are liquids that will allow you to see your fingers on the other side of a clear glass. Be sure liquids are NOT red in color, and not cloudy, but CLEAR.  DO NOT EAT OR DRINK ANY OF THE FOLLOWING:  Dairy products of any kind   Cranberry juice Tomato juice / V8 juice   Grapefruit juice Orange juice     Red grape juice  Do not eat any solid foods, including such foods as: cereal, oatmeal, yogurt, fruits, vegetables, creamed soups, eggs, bread, crackers, pureed foods in a blender, etc.   HELPFUL HINTS FOR DRINKING PREP SOLUTION:   Make sure prep is extremely cold. Mix and refrigerate the the morning of the prep. You may also put in the freezer.   You may try mixing some Crystal Light or Country Time Lemonade if you prefer. Mix in small amounts; add more if necessary.  Try drinking through a straw  Rinse mouth with water or a mouthwash between glasses, to remove after-taste.  Try sipping on a cold beverage /ice/ popsicles between glasses of prep.  Place a piece of sugar-free hard candy in mouth between glasses.  If you become nauseated, try consuming smaller amounts, or stretch out the time between  glasses. Stop for 30-60 minutes, then slowly start back drinking.        OTHER INSTRUCTIONS  You will need a responsible adult at least 52 years of age to accompany you and drive you home. This person must remain in the waiting room during your procedure. The hospital will cancel your procedure if you do not have a responsible adult with you.   1. Wear loose fitting clothing that is easily removed. 2. Leave jewelry and other valuables at home.  3. Remove all body piercing jewelry and leave at home. 4. Total time from sign-in until discharge is approximately 2-3  hours. 5. You should go home directly after your procedure and rest. You can resume normal activities the day after your procedure. 6. The day of your procedure you should not:  Drive  Make legal decisions  Operate machinery  Drink alcohol  Return to work   You may call the office (Dept: [phone]) before 5:00pm, or page the doctor on call [phone]) after 5:00pm, for further instructions, if necessary.   Insurance Information YOU WILL NEED TO CHECK WITH YOUR INSURANCE COMPANY FOR THE BENEFITS OF COVERAGE YOU HAVE FOR THIS PROCEDURE.  UNFORTUNATELY, NOT ALL INSURANCE COMPANIES HAVE BENEFITS TO COVER ALL OR PART OF THESE TYPES OF PROCEDURES.  IT IS YOUR RESPONSIBILITY TO CHECK YOUR BENEFITS, HOWEVER, WE WILL BE GLAD TO ASSIST YOU WITH ANY CODES YOUR INSURANCE COMPANY MAY NEED.    PLEASE NOTE THAT MOST INSURANCE COMPANIES WILL NOT COVER A SCREENING COLONOSCOPY FOR PEOPLE UNDER THE AGE OF 50  IF YOU HAVE BCBS INSURANCE, YOU MAY HAVE BENEFITS FOR A SCREENING COLONOSCOPY BUT IF POLYPS ARE FOUND THE DIAGNOSIS WILL CHANGE AND THEN YOU MAY HAVE A DEDUCTIBLE THAT WILL NEED TO BE MET. SO PLEASE MAKE SURE YOU CHECK YOUR BENEFITS FOR A SCREENING COLONOSCOPY AS WELL AS A DIAGNOSTIC COLONOSCOPY.

## 2018-10-21 NOTE — Addendum Note (Signed)
Addended by: Metro Kung on: 10/21/2018 10:35 AM   Modules accepted: Orders, SmartSet

## 2018-10-21 NOTE — Progress Notes (Signed)
Called pt and scheduled her for a procedure on 12/22/2018 and Covid screening on 12/19/2018.  Pt aware that we are sending prep to pharmacy and mailing her prep instructions.  Pt voiced understanding.

## 2018-10-21 NOTE — Addendum Note (Signed)
Addended by: Metro Kung on: 10/21/2018 10:28 AM   Modules accepted: Orders

## 2018-11-06 NOTE — Progress Notes (Addendum)
Rescheduled pt to 01/26/2019 due to [facility] being out.  Pt is aware that I am mailing out new prep instructions and Covid screening info.  Endo notified.

## 2018-11-20 ENCOUNTER — Other Ambulatory Visit: Payer: Self-pay | Admitting: *Deleted

## 2018-11-20 DIAGNOSIS — Z20822 Contact with and (suspected) exposure to covid-19: Secondary | ICD-10-CM

## 2018-11-21 ENCOUNTER — Telehealth: Payer: Self-pay | Admitting: General Practice

## 2018-11-21 LAB — NOVEL CORONAVIRUS, NAA: SARS-CoV-2, NAA: NOT DETECTED

## 2018-11-21 NOTE — Telephone Encounter (Signed)
Negative COVID results given. Patient results "NOT Detected." Caller expressed understanding. ° °

## 2018-12-19 ENCOUNTER — Other Ambulatory Visit (HOSPITAL_COMMUNITY): Payer: Managed Care, Other (non HMO)

## 2018-12-31 ENCOUNTER — Telehealth: Payer: Self-pay | Admitting: Gastroenterology

## 2018-12-31 NOTE — Telephone Encounter (Signed)
Called pt and she wanted to hold off on rescheduling for now.  She said that she may give [place] a call back at the beginning of next year to reschedule her procedure.  Pt aware that if it becomes more than 6 months that she would require another referral.  Pt voiced understanding.  Endo notified.

## 2018-12-31 NOTE — Telephone Encounter (Signed)
PATIENT NEEDS TO CANCEL HER PROCEDURE FOR NOW AND WILL RESCHEDULE NEXT YEAR

## 2019-01-05 ENCOUNTER — Other Ambulatory Visit: Payer: Self-pay | Admitting: Orthopedic Surgery

## 2019-01-05 DIAGNOSIS — M259 Joint disorder, unspecified: Secondary | ICD-10-CM

## 2019-01-19 ENCOUNTER — Ambulatory Visit
Admission: RE | Admit: 2019-01-19 | Discharge: 2019-01-19 | Disposition: A | Discharge status: Home / Self Care | Payer: 59 | Source: Ambulatory Visit | Attending: Orthopedic Surgery | Admitting: Orthopedic Surgery

## 2019-01-19 ENCOUNTER — Other Ambulatory Visit: Payer: Self-pay

## 2019-01-19 DIAGNOSIS — M259 Joint disorder, unspecified: Secondary | ICD-10-CM

## 2019-01-23 ENCOUNTER — Other Ambulatory Visit (HOSPITAL_COMMUNITY): Payer: Managed Care, Other (non HMO)

## 2019-01-26 ENCOUNTER — Ambulatory Visit (HOSPITAL_COMMUNITY): Admit: 2019-01-26 | Payer: Managed Care, Other (non HMO) | Admitting: Gastroenterology

## 2019-01-26 ENCOUNTER — Encounter (HOSPITAL_COMMUNITY): Payer: Self-pay

## 2019-01-26 SURGERY — COLONOSCOPY
Anesthesia: Moderate Sedation

## 2019-10-27 ENCOUNTER — Other Ambulatory Visit: Payer: 59

## 2019-10-27 ENCOUNTER — Other Ambulatory Visit: Payer: Self-pay | Admitting: Radiology

## 2019-10-27 DIAGNOSIS — Z20822 Contact with and (suspected) exposure to covid-19: Secondary | ICD-10-CM

## 2019-10-28 LAB — SARS-COV-2, NAA 2 DAY TAT

## 2019-10-28 LAB — NOVEL CORONAVIRUS, NAA: SARS-CoV-2, NAA: NOT DETECTED

## 2020-02-16 ENCOUNTER — Other Ambulatory Visit: Payer: 59

## 2020-02-16 DIAGNOSIS — Z20822 Contact with and (suspected) exposure to covid-19: Secondary | ICD-10-CM

## 2020-02-17 LAB — SARS-COV-2, NAA 2 DAY TAT

## 2020-02-17 LAB — NOVEL CORONAVIRUS, NAA: SARS-CoV-2, NAA: DETECTED — AB

## 2020-02-22 ENCOUNTER — Other Ambulatory Visit: Payer: 59

## 2020-05-03 ENCOUNTER — Ambulatory Visit (HOSPITAL_COMMUNITY)
Admission: RE | Admit: 2020-05-03 | Discharge: 2020-05-03 | Disposition: A | Discharge status: Home / Self Care | Payer: 59 | Source: Ambulatory Visit | Attending: Internal Medicine | Admitting: Internal Medicine

## 2020-05-03 ENCOUNTER — Other Ambulatory Visit: Payer: Self-pay

## 2020-05-03 ENCOUNTER — Other Ambulatory Visit (HOSPITAL_COMMUNITY): Payer: Self-pay | Admitting: Internal Medicine

## 2020-05-03 DIAGNOSIS — R059 Cough, unspecified: Secondary | ICD-10-CM | POA: Insufficient documentation

## 2021-04-06 ENCOUNTER — Other Ambulatory Visit: Payer: Self-pay | Admitting: Obstetrics and Gynecology

## 2021-04-06 DIAGNOSIS — Z1231 Encounter for screening mammogram for malignant neoplasm of breast: Secondary | ICD-10-CM

## 2021-04-25 ENCOUNTER — Ambulatory Visit
Admission: RE | Admit: 2021-04-25 | Discharge: 2021-04-25 | Disposition: A | Discharge status: Home / Self Care | Payer: 59 | Source: Ambulatory Visit | Attending: Obstetrics and Gynecology | Admitting: Obstetrics and Gynecology

## 2021-04-25 DIAGNOSIS — Z1231 Encounter for screening mammogram for malignant neoplasm of breast: Secondary | ICD-10-CM

## 2022-05-10 ENCOUNTER — Encounter: Payer: Self-pay | Admitting: Internal Medicine

## 2022-05-10 DIAGNOSIS — Z1231 Encounter for screening mammogram for malignant neoplasm of breast: Secondary | ICD-10-CM

## 2022-05-21 ENCOUNTER — Other Ambulatory Visit (HOSPITAL_COMMUNITY): Payer: Self-pay | Admitting: Family Medicine

## 2022-05-21 DIAGNOSIS — R109 Unspecified abdominal pain: Secondary | ICD-10-CM

## 2022-06-01 ENCOUNTER — Encounter (HOSPITAL_COMMUNITY): Payer: Self-pay

## 2022-06-01 ENCOUNTER — Other Ambulatory Visit (HOSPITAL_COMMUNITY): Payer: 59

## 2022-06-04 ENCOUNTER — Emergency Department (HOSPITAL_COMMUNITY): Payer: PRIVATE HEALTH INSURANCE

## 2022-06-04 ENCOUNTER — Emergency Department (HOSPITAL_COMMUNITY)
Admission: EM | Admit: 2022-06-04 | Discharge: 2022-06-04 | Disposition: A | Discharge status: Home / Self Care | Payer: PRIVATE HEALTH INSURANCE | Attending: Student | Admitting: Student

## 2022-06-04 ENCOUNTER — Encounter (HOSPITAL_COMMUNITY): Payer: Self-pay

## 2022-06-04 DIAGNOSIS — M5416 Radiculopathy, lumbar region: Secondary | ICD-10-CM | POA: Diagnosis not present

## 2022-06-04 DIAGNOSIS — M545 Low back pain, unspecified: Secondary | ICD-10-CM | POA: Diagnosis present

## 2022-06-04 LAB — I-STAT CHEM 8, ED
BUN: 16 mg/dL (ref 6–20)
Calcium, Ion: 1.19 mmol/L (ref 1.15–1.40)
Chloride: 102 mmol/L (ref 98–111)
Creatinine, Ser: 0.8 mg/dL (ref 0.44–1.00)
Glucose, Bld: 87 mg/dL (ref 70–99)
HCT: 37 % (ref 36.0–46.0)
Hemoglobin: 12.6 g/dL (ref 12.0–15.0)
Potassium: 3.8 mmol/L (ref 3.5–5.1)
Sodium: 139 mmol/L (ref 135–145)
TCO2: 27 mmol/L (ref 22–32)

## 2022-06-04 LAB — I-STAT BETA HCG BLOOD, ED (MC, WL, AP ONLY): I-stat hCG, quantitative: <5 m[IU]/mL (ref <5)

## 2022-06-04 MED ORDER — KETOROLAC TROMETHAMINE 30 MG/ML IJ SOLN
30.0000 mg | Freq: Once | INTRAMUSCULAR | Status: AC
  Administered 2022-06-04: 30 mg via INTRAMUSCULAR
  Filled 2022-06-04: qty 1

## 2022-06-04 NOTE — ED Provider Notes (Signed)
[place] EMERGENCY DEPARTMENT AT [facility] Provider Note   CSN: [id number] Arrival date & time: 06/04/22  1806     History Chief Complaint  Patient presents with   Sciatica    Linda Garner is a 57 y.o. female.  Patient with past history significant for sciatica presents emergency department complaints of right low back pain with radiation to the right leg.  She reports that she was previously seen at [facility] years ago for this and was treated there but was advised that she likely would need further treatment if her symptoms are returning.  She reports that she was able to have several years without any recurrence of her symptoms or not reporting significant right-sided low back pain with radiation into the leg.  She typically avoids taking any medication for pain given Tylenol or ibuprofen but states that she did use an Aleve earlier but did not have any improvement in pain at that time.  Not currently set up with any orthopedist or neurosurgery group.  Currently denies any saddle paresthesia, bowel or bladder incontinence, or weakness.  She is having a difficult time finding a comfortable position.  Denies any urinary symptoms such as dysuria, hematuria, increased urinary frequency or urgency.  HPI     Home Medications Prior to Admission medications   Medication Sig Start Date End Date Taking? Authorizing Provider  Multiple Vitamin (MULTIVITAMIN) tablet Take 1 tablet by mouth daily.    [provider]      Allergies    Codeine    Review of Systems   Review of Systems  Musculoskeletal:  Positive for back pain.  All other systems reviewed and are negative.   Physical Exam Updated Vital Signs BP (!) 159/71 (BP Location: Left Arm)   Pulse 88   Temp 98.1 F (36.7 C) (Oral)   Resp 16   Ht 5\' 1"  (1.549 m)   Wt 85.3 kg   SpO2 100%   BMI 35.52 kg/m  Physical Exam Vitals and nursing note reviewed.  Constitutional:      General: She is not in acute  distress.    Appearance: She is well-developed.  HENT:     Head: Normocephalic and atraumatic.  Eyes:     Conjunctiva/sclera: Conjunctivae normal.  Cardiovascular:     Rate and Rhythm: Normal rate and regular rhythm.     Heart sounds: No murmur heard. Pulmonary:     Effort: Pulmonary effort is normal. No respiratory distress.     Breath sounds: Normal breath sounds.  Abdominal:     Palpations: Abdomen is soft.     Tenderness: There is no abdominal tenderness.  Musculoskeletal:        General: No swelling.     Cervical back: Neck supple.  Skin:    General: Skin is warm and dry.     Capillary Refill: Capillary refill takes less than 2 seconds.  Neurological:     General: No focal deficit present.     Mental Status: She is alert. Mental status is at baseline.     Sensory: No sensory deficit.     Motor: No weakness.     Gait: Gait abnormal.     Deep Tendon Reflexes: Reflexes are normal and symmetric.     Reflex Scores:      Patellar reflexes are 2+ on the right side and 2+ on the left side.      Achilles reflexes are 2+ on the right side and 2+ on the left  side. Psychiatric:        Mood and Affect: Mood normal.     ED Results / Procedures / Treatments   Labs (all labs ordered are listed, but only abnormal results are displayed) Labs Reviewed  I-STAT BETA HCG BLOOD, ED ([facility], [facility], [facility] ONLY)  I-STAT CHEM 8, ED    EKG None  Radiology DG Lumbar Spine Complete  Result Date: 06/04/2022 CLINICAL DATA:  Low back pain EXAM: LUMBAR SPINE - COMPLETE 4+ VIEW COMPARISON:  None Available. FINDINGS: Five lumbar-type vertebral bodies. Normal lumbar lordosis. No evidence of fracture or dislocation. Vertebral body heights and intervertebral disc spaces are maintained. Visualized bony pelvis appears intact. IMPRESSION: Negative. Electronically Signed   By: Charline Bills M.D.   On: 06/04/2022 19:03    Procedures Procedures   Medications Ordered in ED Medications  ketorolac (TORADOL)  30 MG/ML injection 30 mg (30 mg Intramuscular Given 06/04/22 1928)    ED Course/ Medical Decision Making/ A&P                           Medical Decision Making Amount and/or Complexity of Data Reviewed Radiology: ordered.  Risk Prescription drug management.   This patient presents to the ED for concern of sciatica.  Differential diagnosis includes lumbar radiculopathy, cauda equina syndrome, loss epidural abscess, disc herniation   Lab Tests:  I Ordered, and personally interpreted labs.  The pertinent results include: I-STAT Chem-8 unremarkable, i-STAT beta-hCG negative   Imaging Studies ordered:  I ordered imaging studies including x-ray of lumbar spine I independently visualized and interpreted imaging which showed no acute fractures or dislocations I agree with the radiologist interpretation   Medicines ordered and prescription drug management:  I ordered medication including Toradol for pain Reevaluation of the patient after these medicines showed that the patient is unchanged I have reviewed the patients home medicines and have made adjustments as needed   Problem List / ED Course:  Patient presents to the emergency department complaints of right-sided low back pain with radiation into the right leg.  She reports has been going on for few days and has been progressively worsening.  Previously been treated for sciatica but reports that her insurance changed recently and will be unable to follow-up with the same orthopedist that previously assisted her.  Currently denying concerning red flag symptoms such as cauda equina-like presentation with no evidence of any saddle paresthesias, bowel or bladder incontinence.  Patient also lacking other red flag symptoms such as anticoagulant use, fever, immunocompromise state, IV drug use, trauma.  Dose of Toradol given to patient.  Emergency department for improved symptomatic relief.  Patient denies any obvious improvement while here in  the ED this likely would not take effect this quickly for her.  Advised patient to manage symptoms at home as best she can with over-the-counter pain medication such as Tylenol ibuprofen.  Preferably I would have patient take ibuprofen to discuss maximum dose ibuprofen that she should be taking and scheduled to take this medication.  Also provided patient with information for [place] [facility] so she may seek evaluation if she feels her symptoms or not improving or they begin to worsen.  Did discuss all red flag precautions to have patient return to the emergency department such as lower leg and extremity numbness or loss of motor function, saddle paresthesia, loss of bowel or bladder control, poikilothermia.  Patient verbalized understanding treatment plan and all return precautions.  All questions answered  prior to patient discharge.   Social Determinants of Health:  Recent insurance change and no longer able to be seen by her orthopedic surgeon  Final Clinical Impression(s) / ED Diagnoses Final diagnoses:  Lumbar radiculopathy, right    Rx / DC Orders ED Discharge Orders     None         Salomon Mast 06/04/22 2240    Glendora Score, MD 06/05/22 0126

## 2022-06-04 NOTE — ED Notes (Signed)
Pt verbalized understanding of discharge instructions. Pt ambulated from Ed .

## 2022-06-04 NOTE — Discharge Instructions (Signed)
You are seen in the emergency department for low back pain.  I believe this is consistent with sciatica or lumbar radiculopathy.  She manage her symptoms this is a can at home with over-the-counter pain medication such as Tylenol, ibuprofen, Aleve.  If you take ibuprofen, take ibuprofen 800 mg 3 times a day.  You begin to experience any neurological symptoms such as a numb groin, bowel or bladder incontinence, or inability to walk due to foot immobility, please return to the emergency department for further evaluation.

## 2022-06-04 NOTE — ED Notes (Signed)
Patient transported to X-ray 

## 2022-06-04 NOTE — ED Triage Notes (Signed)
Pt states that she is having right sided sciatica. Pt has hx of same. Pt states her insurance changed, used to be seen at [facility]. Pt states no relief with Aleve.

## 2022-10-30 ENCOUNTER — Encounter: Payer: Self-pay | Admitting: Obstetrics and Gynecology

## 2023-01-06 IMAGING — MG MM DIGITAL SCREENING BILAT W/ TOMO AND CAD
8 series · 8 of 24 positions shown · non-contrast
Comparison: Previous exam(s).

ACR Breast Density Category a: The breast tissue is almost entirely
fatty.

CLINICAL DATA: Screening.

EXAM:
DIGITAL SCREENING BILATERAL MAMMOGRAM WITH TOMOSYNTHESIS AND CAD
TECHNIQUE: Bilateral screening digital craniocaudal and mediolateral oblique
mammograms were obtained. Bilateral screening digital breast
tomosynthesis was performed. The images were evaluated with
computer-aided detection.

[L MLO synth-2D]
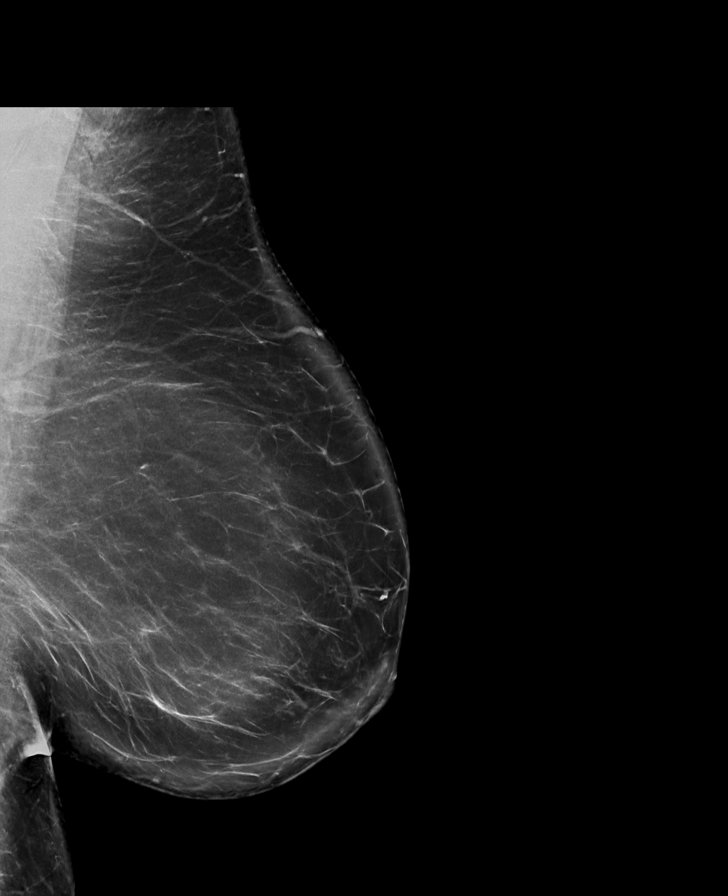

[R CC synth-2D]
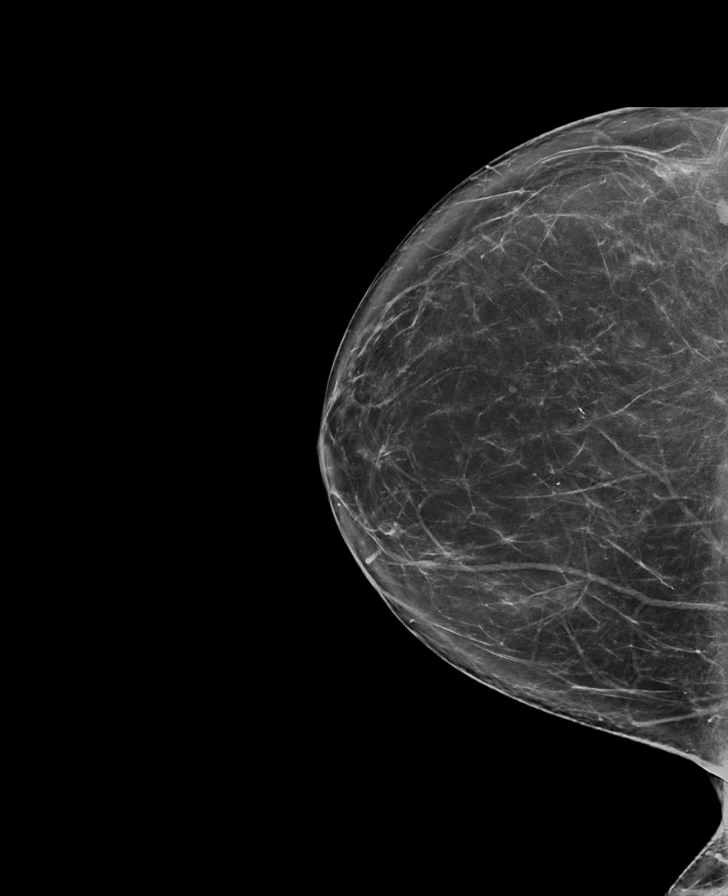

[R MLO synth-2D]
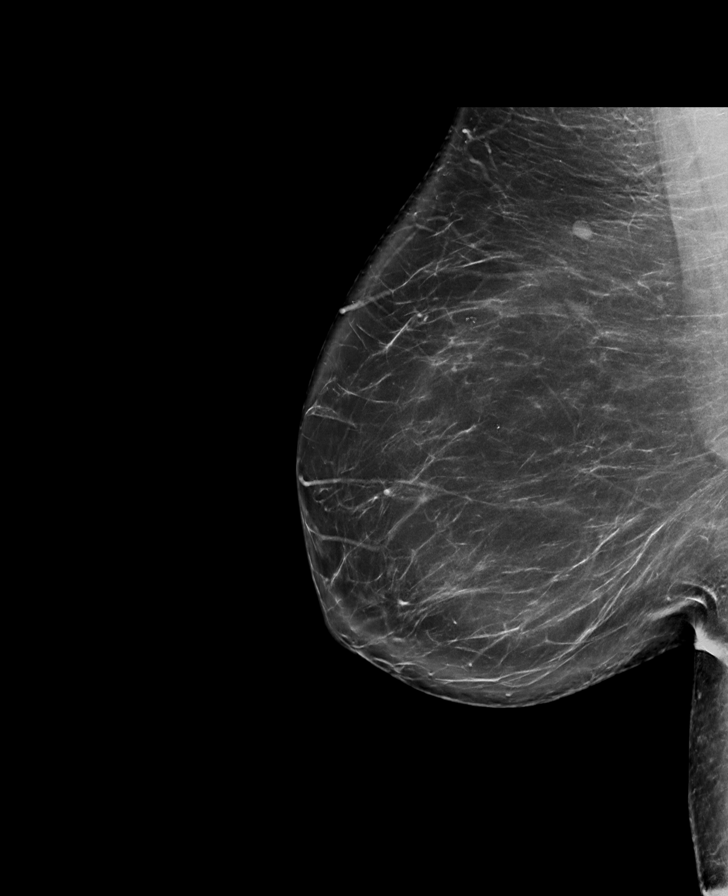

[L CC synth-2D]
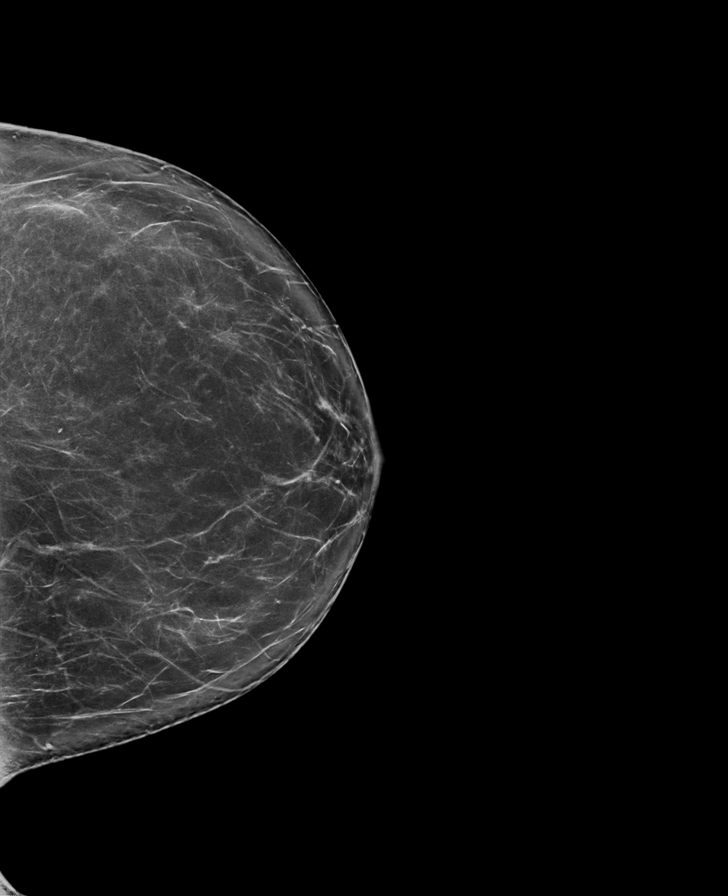

[L MLO tomo · tomo slice 55/108.0]
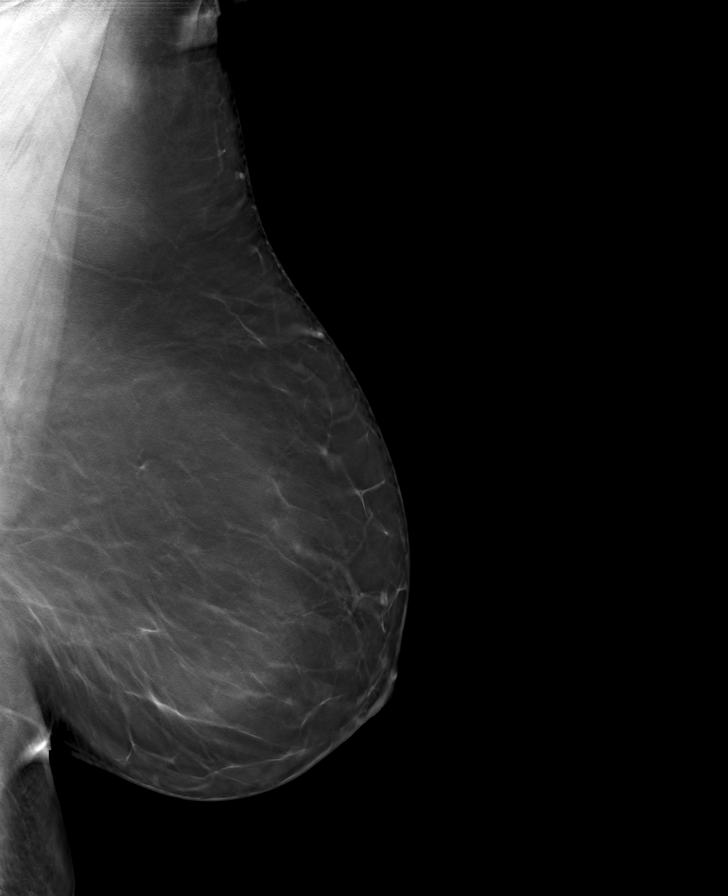

[R CC tomo · tomo slice 43/84.0]
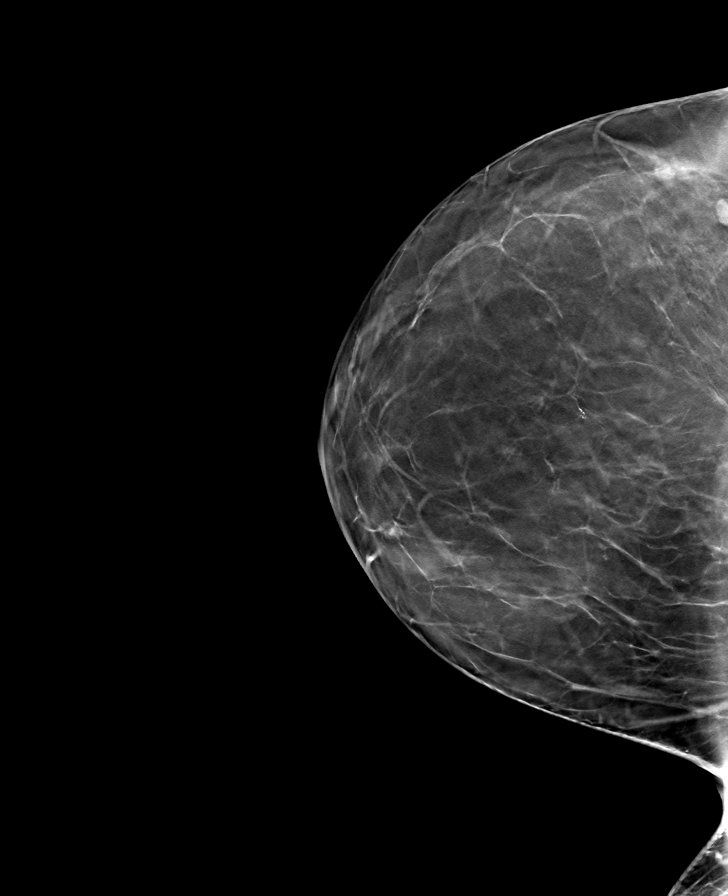

[L CC tomo · tomo slice 42/83.0]
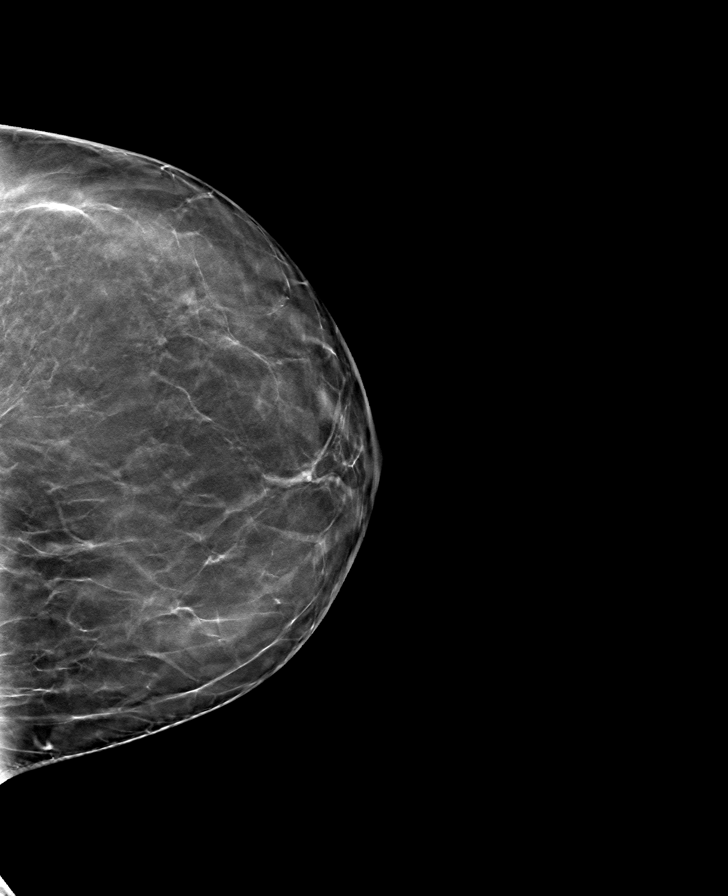

[R MLO tomo · tomo slice 49/98.0]
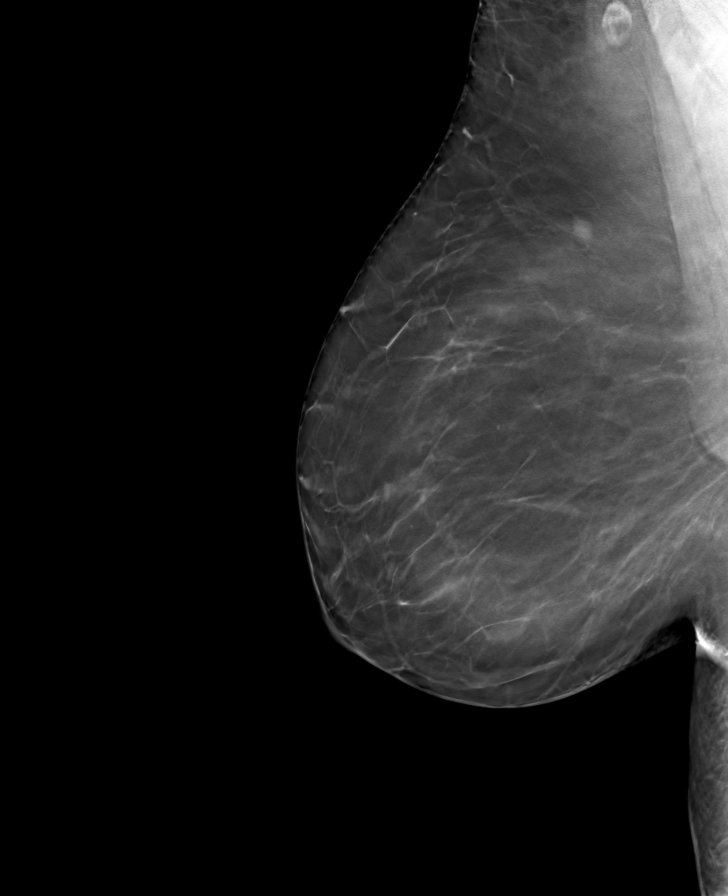

[8 of 24 positions shown; findings below may reference images not displayed]

FINDINGS: There are no findings suspicious for malignancy.
IMPRESSION: No mammographic evidence of malignancy. A result letter of this
screening mammogram will be mailed directly to the patient.

RECOMMENDATION:
Screening mammogram in one year. (Code:0E-3-N98)

BI-RADS CATEGORY  1: Negative.

## 2023-03-20 ENCOUNTER — Other Ambulatory Visit: Payer: Self-pay | Admitting: Obstetrics and Gynecology

## 2023-03-20 DIAGNOSIS — Z1231 Encounter for screening mammogram for malignant neoplasm of breast: Secondary | ICD-10-CM

## 2023-04-26 ENCOUNTER — Ambulatory Visit
Admission: RE | Admit: 2023-04-26 | Discharge: 2023-04-26 | Disposition: A | Discharge status: Home / Self Care | Payer: Self-pay | Source: Ambulatory Visit | Attending: Obstetrics and Gynecology | Admitting: Obstetrics and Gynecology

## 2023-04-26 DIAGNOSIS — Z1231 Encounter for screening mammogram for malignant neoplasm of breast: Secondary | ICD-10-CM

## 2023-12-03 ENCOUNTER — Other Ambulatory Visit (HOSPITAL_COMMUNITY): Payer: Self-pay | Admitting: Family Medicine

## 2023-12-03 ENCOUNTER — Other Ambulatory Visit: Payer: Self-pay | Admitting: Family Medicine

## 2023-12-03 ENCOUNTER — Encounter (HOSPITAL_COMMUNITY): Payer: Self-pay | Admitting: Family Medicine

## 2023-12-03 DIAGNOSIS — R1011 Right upper quadrant pain: Secondary | ICD-10-CM

## 2023-12-06 ENCOUNTER — Ambulatory Visit
Admission: RE | Admit: 2023-12-06 | Discharge: 2023-12-06 | Disposition: A | Payer: PRIVATE HEALTH INSURANCE | Source: Ambulatory Visit | Attending: Family Medicine | Admitting: Family Medicine

## 2023-12-06 DIAGNOSIS — R1011 Right upper quadrant pain: Secondary | ICD-10-CM

## 2023-12-25 ENCOUNTER — Encounter: Payer: Self-pay | Admitting: Physician Assistant

## 2024-02-10 NOTE — Progress Notes (Signed)
 "  Chief Complaint: GERD and globus pharyngeus  HPI:    Linda Garner is a 58 year old female with past medical history as listed below including anxiety and depression, who was referred to me by Marvine Rush, MD for a complaint of GERD and globus pharyngeus.      12/06/2023 right upper quadrant ultrasound with polyps and several punctate echogenic lesions measuring up to 4.6 mm in the gallbladder. Follow up U/S recommended in 6 months.    12/24/23 patient seen by general surgery.  At that time discussed several abdominal symptoms including diffuse abdominal cramping after eating, constipation and reflux.  Patient referred to us  for abdominal symptoms as well as surveillance colonoscopy and EGD given reflux.  Plan for surgery to follow-up in 2 months or once GI is seeing her and procedures performed.    Today, patient presents to clinic and tells me she has a variety of GI issues.  Recently over the past month and 1/2-2 she has had a change in bowel habits.  Described that she will have a loose or normal stool within 30 to 60 minutes after eating often preceded by generalized abdominal cramping which is relieved afterwards.  Patient only sees some bright red blood on the toilet paper when wiping if she has to strain for stool which she tells me is hemorrhoids.    Has also had a feeling of something being in her throat sometimes when nothing is really there but no true dysphagia.  Also reflux and eructations as well as gas.  Sometimes she tells me that when she takes a drink of liquid she feels like she gets choked.  Currently she is not on any medications for reflux.    Briefly discusses her gallbladder issues as above, apparently she is to follow-up with general surgery after being seen by us  for procedures.    Denies fever, chills, weight loss or symptoms that awaken her from sleep.  Past Medical History:  Diagnosis Date   Anxiety    Depression    Migraines    Obesity     Past Surgical History:   Procedure Laterality Date   CESAREAN SECTION     X2   PARTIAL HYSTERECTOMY  2007    Current Outpatient Medications  Medication Sig Dispense Refill   Multiple Vitamin (MULTIVITAMIN) tablet Take 1 tablet by mouth daily.     No current facility-administered medications for this visit.    Allergies as of 02/12/2024 - Review Complete 06/04/2022  Allergen Reaction Noted   Codeine Other (See Comments) 05/16/2017    Family History  Problem Relation Age of Onset   Breast cancer Mother 55    Social History   Socioeconomic History   Marital status: Married    Spouse name: Not on file   Number of children: 2   Years of education: Not on file   Highest education level: Not on file  Occupational History   Occupation: UNEMPLOYED  Tobacco Use   Smoking status: Some Days   Smokeless tobacco: Never   Tobacco comments:    1 cigarette occasionally  Vaping Use   Vaping status: Never Used  Substance and Sexual Activity   Alcohol use: Yes    Comment: 1 glass of wine a week   Drug use: No   Sexual activity: Not on file  Other Topics Concern   Not on file  Social History Narrative   Not on file   Social Drivers of Health   Tobacco Use: High Risk (12/24/2023)  Received from [facility]   Patient History    Smoking Tobacco Use: Some Days    Smokeless Tobacco Use: Never    Passive Exposure: Not on file  Financial Resource Strain: Not on file  Food Insecurity: Not on file  Transportation Needs: Not on file  Physical Activity: Not on file  Stress: Not on file  Social Connections: Not on file  Intimate Partner Violence: Not on file  Depression (PHQ2-9): Not on file  Alcohol Screen: Not on file  Housing: Not on file  Utilities: Not on file  Health Literacy: Not on file    Review of Systems:    Constitutional: No weight loss, fever or chills Skin: No rash Cardiovascular: No chest pain Respiratory: No SOB  Gastrointestinal: See HPI and otherwise  negative Genitourinary: No dysuria  Neurological: No headache, dizziness or syncope Musculoskeletal: No new muscle or joint pain Hematologic: No bleeding  Psychiatric: No history of depression or anxiety   Physical Exam:  Vital signs: BP 112/82 (BP Location: Right Arm, Patient Position: Sitting, Cuff Size: Large)   Pulse 82   Ht 5' 1 (1.549 m)   Wt 207 lb 2 oz (94 kg)   BMI 39.14 kg/m    Constitutional:   Pleasant AA female appears to be in NAD, Well developed, Well nourished, alert and cooperative Head:  Normocephalic and atraumatic. Eyes:   PEERL, EOMI. No icterus. Conjunctiva pink. Ears:  Normal auditory acuity. Neck:  Supple Throat: Oral cavity and pharynx without inflammation, swelling or lesion.  Respiratory: Respirations even and unlabored. Lungs clear to auscultation bilaterally.   No wheezes, crackles, or rhonchi.  Cardiovascular: Normal S1, S2. No MRG. Regular rate and rhythm. No peripheral edema, cyanosis or pallor.  Gastrointestinal:  Soft, nondistended, nontender. No rebound or guarding. Normal bowel sounds. No appreciable masses or hepatomegaly. Rectal:  Not performed.  Msk:  Symmetrical without gross deformities. Without edema, no deformity or joint abnormality.  Neurologic:  Alert and  oriented x4;  grossly normal neurologically.  Skin:   Dry and intact without significant lesions or rashes. Psychiatric: Demonstrates good judgement and reason without abnormal affect or behaviors.  No recent labs. See HPI for imaging.  Assessment: 1.  GERD: With globus sensation, currently not on a PPI, discusses a lot of eructations and indigestion; likely gastritis 2.  Gallbladder polyps: Following general surgery, they are talking out possible cholecystectomy as they feel like this may be related to some of her problems, but wanted us  to work her up first 3.  Change in bowel habits: Patient describes having a solid or loose stool about 30 to 60 minutes after eating over the past  month and 1/2-2, often preceded by abdominal cramping, has been under a lot of stress with taking care of her family; consider IBS most likely 4.  Eructations and gas  Plan: 1.  Scheduled patient for a diagnostic EGD and diagnostic colonoscopy given a variety of GI symptoms.  Did provide the patient with a detailed list of risks for the procedures and she agrees to proceed. Patient is appropriate for endoscopic procedure(s) in the ambulatory ([facility]) setting.  2.  Procedure scheduled with Dr. Wilhelmenia. 3.  Started the patient on Pantoprazole  40 mg daily, 30 minutes before breakfast.  #30 with 5 refills 4.  Discussed antireflux diet and lifestyle modifications. 5.  Discussed with patient that after EGD and colonoscopy pending results would recommend that she follow-up with general surgery.  If she ends up having a cholecystectomy  would like to see her back a few months after that to see what is still happening GI wise and how much her gallbladder was playing a role.  She verbalized understanding. 6.  Patient to follow in clinic with us  per recommendations from Dr. Wilhelmenia after time of procedures.  Delon Failing, PA-C [facility] 02/10/2024, 11:17 AM  Cc: Marvine Rush, MD  "

## 2024-02-12 ENCOUNTER — Ambulatory Visit: Payer: Self-pay | Admitting: Physician Assistant

## 2024-02-12 ENCOUNTER — Encounter: Payer: Self-pay | Admitting: Physician Assistant

## 2024-02-12 VITALS — BP 112/82 | HR 82 | Ht 61.0 in | Wt 207.1 lb

## 2024-02-12 DIAGNOSIS — K219 Gastro-esophageal reflux disease without esophagitis: Secondary | ICD-10-CM | POA: Diagnosis not present

## 2024-02-12 DIAGNOSIS — R1084 Generalized abdominal pain: Secondary | ICD-10-CM

## 2024-02-12 DIAGNOSIS — K824 Cholesterolosis of gallbladder: Secondary | ICD-10-CM | POA: Diagnosis not present

## 2024-02-12 DIAGNOSIS — R09A2 Foreign body sensation, throat: Secondary | ICD-10-CM

## 2024-02-12 DIAGNOSIS — R194 Change in bowel habit: Secondary | ICD-10-CM

## 2024-02-12 DIAGNOSIS — R142 Eructation: Secondary | ICD-10-CM

## 2024-02-12 MED ORDER — NA SULFATE-K SULFATE-MG SULF 17.5-3.13-1.6 GM/177ML PO SOLN: 1.0000 | Freq: Once | ORAL | 0 refills | Status: AC

## 2024-02-12 MED ORDER — PANTOPRAZOLE SODIUM 40 MG PO TBEC: 40.0000 mg | DELAYED_RELEASE_TABLET | Freq: Every day | ORAL | 5 refills | Status: AC

## 2024-02-12 NOTE — Patient Instructions (Signed)
 You have been scheduled for an endoscopy and colonoscopy. Please follow the written instructions given to you at your visit today.  If you use inhalers (even only as needed), please bring them with you on the day of your procedure.  DO NOT TAKE 7 DAYS PRIOR TO TEST- Trulicity (dulaglutide) Ozempic, Wegovy (semaglutide) Mounjaro, Zepbound (tirzepatide) Bydureon Bcise (exanatide extended release)  DO NOT TAKE 1 DAY PRIOR TO YOUR TEST Rybelsus (semaglutide) Adlyxin (lixisenatide) Victoza (liraglutide) Byetta (exanatide) ___________________________________________________________________________  _______________________________________________________  If your blood pressure at your visit was 140/90 or greater, please contact your primary care physician to follow up on this.  _______________________________________________________  If you are age 70 or older, your body mass index should be between 23-30. Your Body mass index is 39.14 kg/m. If this is out of the aforementioned range listed, please consider follow up with your Primary Care Provider.  If you are age 24 or younger, your body mass index should be between 19-25. Your Body mass index is 39.14 kg/m. If this is out of the aformentioned range listed, please consider follow up with your Primary Care Provider.   ________________________________________________________  The [facility] providers would like to encourage you to use MYCHART to communicate with providers for non-urgent requests or questions.  Due to long hold times on the telephone, sending your provider a message by [facility] may be a faster and more efficient way to get a response.  Please allow 48 business hours for a response.  Please remember that this is for non-urgent requests.  _______________________________________________________  [facility] is using a team-based approach to care.  Your team is made up of your doctor and two to three APPS. Our APPS  (Nurse Practitioners and Physician Assistants) work with your physician to ensure care continuity for you. They are fully qualified to address your health concerns and develop a treatment plan. They communicate directly with your gastroenterologist to care for you. Seeing the Advanced Practice Practitioners on your physician's team can help you by facilitating care more promptly, often allowing for earlier appointments, access to diagnostic testing, procedures, and other specialty referrals.   Due to recent changes in healthcare laws, you may see the results of your imaging and laboratory studies on MyChart before your provider has had a chance to review them.  We understand that in some cases there may be results that are confusing or concerning to you. Not all laboratory results come back in the same time frame and the provider may be waiting for multiple results in order to interpret others.  Please give us  48 hours in order for your provider to thoroughly review all the results before contacting the office for clarification of your results.   We have sent the following medications to your pharmacy for you to pick up at your convenience: Pantoprazole  40 mg

## 2024-02-14 NOTE — Progress Notes (Signed)
 Attending Physician's Attestation   I have reviewed the chart.   I agree with the Advanced Practitioner's note, impression, and recommendations with any updates as below.    Corliss Parish, MD [facility] Advanced Endoscopy Office # [phone]

## 2024-02-21 ENCOUNTER — Encounter: Payer: Self-pay | Admitting: Gastroenterology

## 2024-02-21 ENCOUNTER — Ambulatory Visit: Admitting: Gastroenterology

## 2024-02-21 VITALS — BP 153/90 | HR 77 | Temp 97.5°F | Resp 20 | Ht 61.0 in | Wt 207.0 lb

## 2024-02-21 DIAGNOSIS — K31A0 Gastric intestinal metaplasia, unspecified: Secondary | ICD-10-CM

## 2024-02-21 DIAGNOSIS — R197 Diarrhea, unspecified: Secondary | ICD-10-CM

## 2024-02-21 DIAGNOSIS — K5730 Diverticulosis of large intestine without perforation or abscess without bleeding: Secondary | ICD-10-CM | POA: Diagnosis not present

## 2024-02-21 DIAGNOSIS — K6289 Other specified diseases of anus and rectum: Secondary | ICD-10-CM | POA: Diagnosis not present

## 2024-02-21 DIAGNOSIS — Z1211 Encounter for screening for malignant neoplasm of colon: Secondary | ICD-10-CM

## 2024-02-21 DIAGNOSIS — K2970 Gastritis, unspecified, without bleeding: Secondary | ICD-10-CM | POA: Diagnosis not present

## 2024-02-21 DIAGNOSIS — K640 First degree hemorrhoids: Secondary | ICD-10-CM | POA: Diagnosis not present

## 2024-02-21 DIAGNOSIS — R194 Change in bowel habit: Secondary | ICD-10-CM

## 2024-02-21 DIAGNOSIS — K219 Gastro-esophageal reflux disease without esophagitis: Secondary | ICD-10-CM

## 2024-02-21 MED ORDER — SODIUM CHLORIDE 0.9 % IV SOLN: 500.0000 mL | Freq: Once | INTRAVENOUS | Status: DC

## 2024-02-21 NOTE — Patient Instructions (Signed)
 Thank you for letting us  care for your healthcare needs today! Please see handouts regarding Gastritis, Diverticulosis, High Fiber Diet and Hemorrhoids.  - Follow High Fiber Diet - Use FiberCon 1-2 tablets by mouth daily - Continue present medications - Await pathology results  YOU HAD AN ENDOSCOPIC PROCEDURE TODAY AT THE [facility]:   Refer to the procedure report that was given to you for any specific questions about what was found during the examination.  If the procedure report does not answer your questions, please call your gastroenterologist to clarify.  If you requested that your care partner not be given the details of your procedure findings, then the procedure report has been included in a sealed envelope for you to review at your convenience later.  YOU SHOULD EXPECT: Some feelings of bloating in the abdomen. Passage of more gas than usual.  Walking can help get rid of the air that was put into your GI tract during the procedure and reduce the bloating. If you had a lower endoscopy (such as a colonoscopy or flexible sigmoidoscopy) you may notice spotting of blood in your stool or on the toilet paper. If you underwent a bowel prep for your procedure, you may not have a normal bowel movement for a few days.  Please Note:  You might notice some irritation and congestion in your nose or some drainage.  This is from the oxygen used during your procedure.  There is no need for concern and it should clear up in a day or so.  SYMPTOMS TO REPORT IMMEDIATELY:  Following lower endoscopy (colonoscopy or flexible sigmoidoscopy):  Excessive amounts of blood in the stool  Significant tenderness or worsening of abdominal pains  Swelling of the abdomen that is new, acute  Fever of 100F or higher  Following upper endoscopy (EGD)  Vomiting of blood or coffee ground material  New chest pain or pain under the shoulder blades  Painful or persistently difficult swallowing  New shortness  of breath  Fever of 100F or higher  Black, tarry-looking stools  For urgent or emergent issues, a gastroenterologist can be reached at any hour by calling [phone]. Do not use MyChart messaging for urgent concerns.    DIET:  We do recommend a small meal at first, but then you may proceed to your regular diet.  Drink plenty of fluids but you should avoid alcoholic beverages for 24 hours.  ACTIVITY:  You should plan to take it easy for the rest of today and you should NOT DRIVE or use heavy machinery until tomorrow (because of the sedation medicines used during the test).    FOLLOW UP: Our staff will call the number listed on your records the next business day following your procedure.  We will call around 7:15- 8:00 am to check on you and address any questions or concerns that you may have regarding the information given to you following your procedure. If we do not reach you, we will leave a message.     If any biopsies were taken you will be contacted by phone or by letter within the next 1-3 weeks.  Please call us  at [phone] if you have not heard about the biopsies in 3 weeks.    SIGNATURES/CONFIDENTIALITY: You and/or your care partner have signed paperwork which will be entered into your electronic medical record.  These signatures attest to the fact that that the information above on your After Visit Summary has been reviewed and is understood.  Full  responsibility of the confidentiality of this discharge information lies with you and/or your care-partner.

## 2024-02-21 NOTE — Op Note (Signed)
 [facility] Patient Name: Linda Garner Procedure Date: 02/21/2024 10:32 AM MRN: [id number] Endoscopist: Aloha Finner , MD, [id number] Age: 59 Referring MD:  Date of Birth: Jun 29, 1965 Gender: Female Account #: [id number] Procedure:                Colonoscopy Indications:              Screening for colorectal malignant neoplasm,                            Incidental change in bowel habits noted, Incidental                            diarrhea noted Medicines:                Monitored Anesthesia Care Procedure:                Pre-Anesthesia Assessment:                           - Prior to the procedure, a History and Physical                            was performed, and patient medications and                            allergies were reviewed. The patient's tolerance of                            previous anesthesia was also reviewed. The risks                            and benefits of the procedure and the sedation                            options and risks were discussed with the patient.                            All questions were answered, and informed consent                            was obtained. Prior Anticoagulants: The patient has                            taken no anticoagulant or antiplatelet agents. ASA                            Grade Assessment: II - A patient with mild systemic                            disease. After reviewing the risks and benefits,                            the patient was deemed in satisfactory condition to  undergo the procedure.                           After obtaining informed consent, the colonoscope                            was passed under direct vision. Throughout the                            procedure, the patient's blood pressure, pulse, and                            oxygen saturations were monitored continuously. The                            Olympus Scope SN: [id number] was introduced  through                            the anus and advanced to the 3 cm into the ileum.                            The colonoscopy was performed without difficulty.                            The patient tolerated the procedure. The quality of                            the bowel preparation was good. The terminal ileum,                            ileocecal valve, appendiceal orifice, and rectum                            were photographed. Scope In: 10:54:42 AM Scope Out: 11:04:34 AM Scope Withdrawal Time: 0 hours 8 minutes 1 second  Total Procedure Duration: 0 hours 9 minutes 52 seconds  Findings:                 The digital rectal exam was normal. Pertinent                            negatives include no palpable rectal lesions.                           The terminal ileum and ileocecal valve appeared                            normal.                           Multiple small-mouthed diverticula were found in                            the recto-sigmoid colon and sigmoid colon.  Normal mucosa was found in the entire colon                            otherwise. Biopsies for histology were taken with a                            cold forceps from the entire colon for evaluation                            of microscopic colitis.                           Anal papillae were hypertrophied.                           Non-bleeding non-thrombosed internal hemorrhoids                            were found during retroflexion. The hemorrhoids                            were Grade I (internal hemorrhoids that do not                            prolapse). Complications:            No immediate complications. Estimated Blood Loss:     Estimated blood loss was minimal. Impression:               - The examined portion of the ileum was normal.                           - Diverticulosis in the recto-sigmoid colon and in                            the sigmoid colon.                            - Normal mucosa in the entire examined colon                            otherwise. Biopsied.                           - Anal papillae were hypertrophied.                           - Non-bleeding non-thrombosed internal hemorrhoids. Recommendation:           - The patient will be observed post-procedure,                            until all discharge criteria are met.                           - Discharge patient to home.                           -  Patient has a contact number available for                            emergencies. The signs and symptoms of potential                            delayed complications were discussed with the                            patient. Return to normal activities tomorrow.                            Written discharge instructions were provided to the                            patient.                           - High fiber diet.                           - Use FiberCon 1-2 tablets PO daily.                           - Continue present medications.                           - Await pathology results.                           - Repeat colonoscopy in 10 years for screening                            purposes (pending no chronic colitis findings are                            noted on pathology).                           - The findings and recommendations were discussed                            with the patient.                           - The findings and recommendations were discussed                            with the patient's family. Aloha Finner, MD 02/21/2024 11:10:55 AM

## 2024-02-21 NOTE — Op Note (Signed)
 [facility] Patient Name: Linda Garner Procedure Date: 02/21/2024 10:41 AM MRN: [id number] Endoscopist: Aloha Finner , MD, [id number] Age: 59 Referring MD:  Date of Birth: Mar 01, 1965 Gender: Female Account #: [id number] Procedure:                Upper GI endoscopy Indications:              Heartburn, Gastro-esophageal reflux disease,                            Diarrhea Medicines:                Monitored Anesthesia Care Procedure:                Pre-Anesthesia Assessment:                           - Prior to the procedure, a History and Physical                            was performed, and patient medications and                            allergies were reviewed. The patient's tolerance of                            previous anesthesia was also reviewed. The risks                            and benefits of the procedure and the sedation                            options and risks were discussed with the patient.                            All questions were answered, and informed consent                            was obtained. Prior Anticoagulants: The patient has                            taken no anticoagulant or antiplatelet agents. ASA                            Grade Assessment: II - A patient with mild systemic                            disease. After reviewing the risks and benefits,                            the patient was deemed in satisfactory condition to                            undergo the procedure.  After obtaining informed consent, the endoscope was                            passed under direct vision. Throughout the                            procedure, the patient's blood pressure, pulse, and                            oxygen saturations were monitored continuously. The                            GIF [id number] #[id number] was introduced through the                            mouth, and advanced to the second part of duodenum.                             The upper GI endoscopy was accomplished without                            difficulty. The patient tolerated the procedure. Scope In: Scope Out: Findings:                 No gross lesions were noted in the entire                            esophagus. Biopsies were taken with a cold forceps                            for histology.                           The Z-line was regular and was found 36 cm from the                            incisors.                           Scattered mild inflammation characterized by                            erosions and erythema was found in the gastric                            antrum.                           No other gross lesions were noted in the entire                            examined stomach. Biopsies were taken with a cold                            forceps for histology and Helicobacter pylori  testing.                           No gross lesions were noted in the duodenal bulb,                            in the first portion of the duodenum and in the                            second portion of the duodenum. Biopsies were taken                            with a cold forceps for histology. Complications:            No immediate complications. Estimated Blood Loss:     Estimated blood loss was minimal. Impression:               - No gross lesions in the entire esophagus.                            Biopsied.                           - Z-line regular, 36 cm from the incisors.                           - Antral gastritis. No other gross lesions in the                            entire stomach. Biopsied.                           - No gross lesions in the duodenal bulb, in the                            first portion of the duodenum and in the second                            portion of the duodenum. Biopsied. Recommendation:           - Proceed to scheduled colonoscopy.                            - Observe patient's clinical course.                           - Continue present medications.                           - The findings and recommendations were discussed                            with the patient.                           - The findings and recommendations were discussed  with the patient's family. Aloha Finner, MD 02/21/2024 11:07:22 AM

## 2024-02-21 NOTE — Progress Notes (Signed)
 Called to room to assist during endoscopic procedure.  Patient ID and intended procedure confirmed with present staff. Received instructions for my participation in the procedure from the performing physician.

## 2024-02-21 NOTE — Progress Notes (Signed)
 "  GASTROENTEROLOGY PROCEDURE H&P NOTE   Primary Care Physician: Marvine Rush, MD  HPI: Linda Garner is a 59 y.o. female who presents for EGD/colonoscopy evaluation of GERD and globus and loose bowel movements and abdominal cramping with bright red blood per rectum.   Past Medical History:  Diagnosis Date   Anxiety    Depression    GERD (gastroesophageal reflux disease)    Migraines    Obesity    Past Surgical History:  Procedure Laterality Date   CESAREAN SECTION     X2   PARTIAL HYSTERECTOMY  2007   Current Outpatient Medications  Medication Sig Dispense Refill   Multiple Vitamin (MULTIVITAMIN ADULT PO) Take 1 tablet by mouth daily.     pantoprazole  (PROTONIX ) 40 MG tablet Take 1 tablet (40 mg total) by mouth daily. (Patient not taking: Reported on 02/21/2024) 30 tablet 5   Current Facility-Administered Medications  Medication Dose Route Frequency Provider Last Rate Last Admin   0.9 %  sodium chloride infusion  500 mL Intravenous Once Mansouraty, Aloha Raddle., MD       Current Medications[1] Allergies[2] Family History  Problem Relation Age of Onset   Breast cancer Mother 63   COPD Father    Heart disease Father    Hyperlipidemia Father    Prostate cancer Brother    Prostate cancer Brother    Colon cancer Neg Hx    Esophageal cancer Neg Hx    Stomach cancer Neg Hx    Social History   Socioeconomic History   Marital status: Married    Spouse name: Not on file   Number of children: 2   Years of education: Not on file   Highest education level: Not on file  Occupational History   Occupation: UNEMPLOYED  Tobacco Use   Smoking status: Some Days    Types: Cigarettes   Smokeless tobacco: Never   Tobacco comments:    1 cigarette occasionally  Vaping Use   Vaping status: Never Used  Substance and Sexual Activity   Alcohol use: Yes    Comment: 1 glass of wine occasionaly   Drug use: No   Sexual activity: Not on file  Other Topics Concern   Not on file   Social History Narrative   Not on file   Social Drivers of Health   Tobacco Use: High Risk (02/21/2024)   Patient History    Smoking Tobacco Use: Some Days    Smokeless Tobacco Use: Never    Passive Exposure: Not on file  Financial Resource Strain: Not on file  Food Insecurity: Not on file  Transportation Needs: Not on file  Physical Activity: Not on file  Stress: Not on file  Social Connections: Not on file  Intimate Partner Violence: Not on file  Depression (EYV7-0): Not on file  Alcohol Screen: Not on file  Housing: Not on file  Utilities: Not on file  Health Literacy: Not on file    Physical Exam: Today's Vitals   02/21/24 1007  BP: (!) 142/77  Pulse: 92  Temp: (!) 97.5 F (36.4 C)  SpO2: 100%  Weight: 207 lb (93.9 kg)  Height: 5' 1 (1.549 m)   Body mass index is 39.11 kg/m. GEN: NAD EYE: Sclerae anicteric ENT: MMM CV: Non-tachycardic GI: Soft, NT/ND NEURO:  Alert & Oriented x 3  Lab Results: No results for input(s): WBC, HGB, HCT, PLT in the last 72 hours. BMET No results for input(s): NA, K, CL, CO2, GLUCOSE, BUN, CREATININE, CALCIUM  in the last 72 hours. LFT No results for input(s): PROT, ALBUMIN, AST, ALT, ALKPHOS, BILITOT, BILIDIR, IBILI in the last 72 hours. PT/INR No results for input(s): LABPROT, INR in the last 72 hours.   Impression / Plan: This is a 59 y.o.female who presents for EGD/colonoscopy evaluation of GERD and globus and loose bowel movements and abdominal cramping with bright red blood per rectum.   The risks and benefits of endoscopic evaluation/treatment were discussed with the patient and/or family; these include but are not limited to the risk of perforation, infection, bleeding, missed lesions, lack of diagnosis, severe illness requiring hospitalization, as well as anesthesia and sedation related illnesses.  The patient's history has been reviewed, patient examined, no change in status,  and deemed stable for procedure.  The patient and/or family was provided an opportunity to ask questions and all were answered.  The patient and/or family is agreeable to proceed.    Aloha Finner, MD [facility] Office # [phone]     [1]  Current Outpatient Medications:    Multiple Vitamin (MULTIVITAMIN ADULT PO), Take 1 tablet by mouth daily., Disp: , Rfl:    pantoprazole  (PROTONIX ) 40 MG tablet, Take 1 tablet (40 mg total) by mouth daily. (Patient not taking: Reported on 02/21/2024), Disp: 30 tablet, Rfl: 5  Current Facility-Administered Medications:    0.9 %  sodium chloride infusion, 500 mL, Intravenous, Once, Mansouraty, Aloha Raddle., MD [2]  Allergies Allergen Reactions   Codeine Other (See Comments)   "

## 2024-02-21 NOTE — Progress Notes (Signed)
 Transferred to PACU via stretcher.  Not responding to stimulation at this time.  VSS upon leaving procedure room.

## 2024-02-24 ENCOUNTER — Telehealth: Payer: Self-pay

## 2024-02-24 NOTE — Telephone Encounter (Signed)
 No answer after follow up call. Voice message left.

## 2024-02-25 ENCOUNTER — Ambulatory Visit: Payer: Self-pay | Admitting: Gastroenterology

## 2024-02-25 LAB — SURGICAL PATHOLOGY
